# Patient Record
Sex: Female | Born: 1980 | Race: White | Hispanic: No | Marital: Married | Smoking: Never smoker
Health system: Southern US, Community
[De-identification: ages and names within clinical notes are randomized; demographics above are authoritative.]

## PROBLEM LIST (undated history)

## (undated) DIAGNOSIS — Z302 Encounter for sterilization: Secondary | ICD-10-CM

## (undated) DIAGNOSIS — O1490 Unspecified pre-eclampsia, unspecified trimester: Secondary | ICD-10-CM

## (undated) DIAGNOSIS — E669 Obesity, unspecified: Secondary | ICD-10-CM

## (undated) DIAGNOSIS — E282 Polycystic ovarian syndrome: Secondary | ICD-10-CM

## (undated) DIAGNOSIS — R87810 Cervical high risk human papillomavirus (HPV) DNA test positive: Secondary | ICD-10-CM

## (undated) DIAGNOSIS — R87610 Atypical squamous cells of undetermined significance on cytologic smear of cervix (ASC-US): Secondary | ICD-10-CM

## (undated) HISTORY — DX: Obesity, unspecified: E66.9

## (undated) HISTORY — DX: Atypical squamous cells of undetermined significance on cytologic smear of cervix (ASC-US): R87.610

## (undated) HISTORY — DX: Encounter for sterilization: Z30.2

## (undated) HISTORY — DX: Polycystic ovarian syndrome: E28.2

## (undated) HISTORY — DX: Cervical high risk human papillomavirus (HPV) DNA test positive: R87.810

## (undated) HISTORY — DX: Unspecified pre-eclampsia, unspecified trimester: O14.90

---
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
## 2004-08-05 ENCOUNTER — Emergency Department: Payer: Self-pay | Admitting: Emergency Medicine

## 2006-01-09 ENCOUNTER — Observation Stay: Payer: Self-pay

## 2006-01-16 ENCOUNTER — Observation Stay: Payer: Self-pay | Admitting: Obstetrics & Gynecology

## 2006-01-25 ENCOUNTER — Observation Stay: Payer: Self-pay | Admitting: Obstetrics & Gynecology

## 2006-01-26 ENCOUNTER — Inpatient Hospital Stay: Payer: Self-pay | Admitting: Unknown Physician Specialty

## 2006-01-26 DIAGNOSIS — O139 Gestational [pregnancy-induced] hypertension without significant proteinuria, unspecified trimester: Secondary | ICD-10-CM

## 2008-09-29 ENCOUNTER — Ambulatory Visit: Payer: Self-pay | Admitting: Unknown Physician Specialty

## 2011-08-10 ENCOUNTER — Inpatient Hospital Stay: Payer: Self-pay | Admitting: Obstetrics and Gynecology

## 2011-08-10 LAB — PIH PROFILE
Anion Gap: 11 (ref 7–16)
BUN: 8 mg/dL (ref 7–18)
Calcium, Total: 8.2 mg/dL — ABNORMAL LOW (ref 8.5–10.1)
EGFR (Non-African Amer.): >60
Glucose: 66 mg/dL (ref 65–99)
HCT: 33.5 % — ABNORMAL LOW (ref 35.0–47.0)
MCH: 26.9 pg (ref 26.0–34.0)
MCHC: 33.4 g/dL (ref 32.0–36.0)
MCV: 80 fL (ref 80–100)
Osmolality: 278 (ref 275–301)
Platelet: 171 10*3/uL (ref 150–440)
Potassium: 3.8 mmol/L (ref 3.5–5.1)
RBC: 4.17 10*6/uL (ref 3.80–5.20)
RDW: 14.9 % — ABNORMAL HIGH (ref 11.5–14.5)
Sodium: 141 mmol/L (ref 136–145)
WBC: 8 10*3/uL (ref 3.6–11.0)

## 2011-08-10 LAB — PROTEIN / CREATININE RATIO, URINE
Protein, Random Urine: 72 mg/dL — ABNORMAL HIGH (ref 0–12)
Protein/Creat. Ratio: 561 mg/gCREAT — ABNORMAL HIGH (ref 0–200)

## 2011-08-11 LAB — SGOT (AST)(ARMC): SGOT(AST): 19 U/L (ref 15–37)

## 2011-08-11 LAB — BASIC METABOLIC PANEL
Anion Gap: 12 (ref 7–16)
Calcium, Total: 7.9 mg/dL — ABNORMAL LOW (ref 8.5–10.1)
Chloride: 109 mmol/L — ABNORMAL HIGH (ref 98–107)
EGFR (Non-African Amer.): >60
Glucose: 61 mg/dL — ABNORMAL LOW (ref 65–99)
Osmolality: 278 (ref 275–301)
Potassium: 4.1 mmol/L (ref 3.5–5.1)
Sodium: 141 mmol/L (ref 136–145)

## 2011-08-11 LAB — RBC: RBC: 3.48 10*6/uL — ABNORMAL LOW (ref 3.80–5.20)

## 2011-08-11 LAB — HEMATOCRIT: HCT: 28 % — ABNORMAL LOW (ref 35.0–47.0)

## 2011-08-11 LAB — HEMOGLOBIN: HGB: 9.3 g/dL — ABNORMAL LOW (ref 12.0–16.0)

## 2011-08-11 LAB — PLATELET COUNT: Platelet: 142 10*3/uL — ABNORMAL LOW (ref 150–440)

## 2011-08-12 LAB — HEMATOCRIT: HCT: 28.6 % — ABNORMAL LOW (ref 35.0–47.0)

## 2011-08-17 LAB — PATHOLOGY REPORT

## 2012-07-04 DIAGNOSIS — R87810 Cervical high risk human papillomavirus (HPV) DNA test positive: Secondary | ICD-10-CM

## 2012-07-04 HISTORY — DX: Cervical high risk human papillomavirus (HPV) DNA test positive: R87.810

## 2014-08-01 HISTORY — PX: COLPOSCOPY W/ BIOPSY / CURETTAGE: SUR283

## 2014-10-26 NOTE — Op Note (Signed)
PATIENT NAME:  Kimberly Holder, Kimberly D MR#:  [id number] DATE OF BIRTH:  1981-01-31  DATE OF PROCEDURE:  08/10/2011  PREOPERATIVE DIAGNOSIS: Nonreassuring tracing remote from delivery.   POSTOPERATIVE DIAGNOSES:  1. Nonreassuring tracing remote from delivery. 2. Abnormal-appearing placenta.   PROCEDURE: Repeat cesarean section.   SURGEON: Elliot Gurneyarrie C. Tayte Mcwherter, M.D.   ASSISTANT: Shella Maximarcia  Putnam, CNM  ESTIMATED BLOOD LOSS: 1000 mL.   ANESTHESIA: Spinal and local.   FINDINGS: Term 4738 week liveborn female infant weighing 7 pounds, 10 ounces and Apgars 9 and 9, delivered by repeat cesarean.   DESCRIPTION OF PROCEDURE: The patient was taken to the operative room and placed in supine position, after adequate spinal anesthesia was instilled. Time out was performed and the patient was prepped and draped in the usual sterile fashion, after an incision was drawn on her abdomen to mark the old incision. A Pfannenstiel skin incision was made approximately three fingerbreadths above the pubic symphysis and carried sharply down to the fascia. The fascia was nicked in the midline and the incision was extended in a superolateral manner. There was quite a bit of scarring seen at this point and suture scissors and curved Mayo's were used to cut through the thickened scarring to get to the fascia to extend the incision with good visualization. The muscle bellies were sharply and bluntly dissected off the rectus fascia. The muscle belly midline was identified and separated. The peritoneum was grasped, cut and extended with the curved Mayo scissors. At this time, there was noted to be a large amount of scarring of the peritoneum to the lower uterine segment. These were clipped and a bladder flap was created, bladder blade was replaced, an uterine incision was made, and  the infant's head was delivered. With vacuum assist and large fundal pressure the infant was delivered. Cord was clamped and cut, infant was bulb suctioned, infant  was handed to awaiting neonatology professional, cord blood was obtained, Pitocin was started, and the placenta was delivered. At this time, some unusual dark, black pointed individual masses were identified in the placenta. At points they looked like worms or pincer graspers of bugs. One of these was pressed on and opened and a large blood clot came out of the corresponding blood vessel. The placenta was sent to pathology. The uterus was wrapped in a moist laparotomy sponge and the anterior uterus was curetted. The lower uterine segment was closed in a running locked chromic suture with an imbricating chromic suture. Two figure-of-eights were placed in the left lower segment and one in the right lower aspect of the incision. The bladder flap was tacked back up to the incision. The belly was cleared of clots and irrigated. The uterus was placed back into the abdomen. The peritoneum was grasped with [facility] and the gutters were then cleaned with more fluid and moist laparotomy sponges.  Suture of Vicryl was then used to approximate the muscle bellies. The muscle bellies were irrigated after Interceed was placed along the uterine incision. The On-Q pain pump was then inserted into the anterior aspect of the rectus muscles. The catheters were threaded and wrapped around on top of the rectus muscles and placed underneath the fascia. The fascia was closed with a running Vicryl suture. The subcutaneous fat was irrigated and cauterized in areas that were bleeding. A plain gut suture was then used to approximate Camper's and Scarpa's fascia, skin clips were placed on the skin, and 2 x 2's were placed around the catheters at the entrance  into the skin after Dermabond had been placed. Two Tegaderms were placed over the catheters and the 4 x 4's and anchored with long Steri-Strips. The catheters were primed with 5 mL each of Marcaine, 0.5%, and then a bandage was placed, first Telfa and then 4 x 4's over the incision. The  uterus was compressed to remove all clots. Clear urine was noted in the Foley bag. The drapes were taken down. The patient was taken to recovery after having tolerated the procedure well.  ____________________________ Elliot Gurney, MD cck:slb D: 08/12/2011 00:21:05 ET T: 08/12/2011 09:33:01 ET JOB#: [id number]  cc: Elliot Gurney, MD, <Dictator> Elliot Gurney MD ELECTRONICALLY SIGNED 09/04/2011 1:21

## 2014-11-11 NOTE — H&P (Signed)
L&D Evaluation:  History:   HPI 34 year old G4P1021 at 38 weeks 3 days sent from office earlier today for PIH eval. BP in office 140/90, 1+ protein dip, swelling of hands, legs, face noted. No c/o headaches or visual disturbances.  EDD 08/21/11, has hx of previous c/s, desires repeat and was [facility] notable for early entry to care and 66lb weight gain.  Labs: A Pos, RI, VI, Hep B and HIV Negataive, glucola 133 GBS Negative Flu vaccine and Tdap both given December 2012    Presents with other, PIH eval    Patient's Medical History No Chronic Illness    Patient's Surgical History Previous C-Section    Medications Pre Natal Vitamins    Allergies NKDA    Social History none    Family History Non-Contributory   ROS:   ROS All systems were reviewed.  HEENT, CNS, GI, GU, Respiratory, CV, Renal and Musculoskeletal systems were found to be normal.   Exam:   Vital Signs BP >140/90  some WNL BP's    Urine Protein 1+, P/C ratio 561    General no apparent distress    Mental Status clear    Chest clear    Abdomen gravid, non-tender    Estimated Fetal Weight Average for gestational age    Back no CVAT    Edema 3+  Pitting    Reflexes 2+    Clonus negative    Pelvic no external lesions    Mebranes Intact    FHT other, some variable decels noted    Ucx regular, absent    Skin dry   Impression:   Impression evaluation for PIH, IUP 38 3/7, elevated BP   Plan:   Plan EFM/NST, PIH panel, Plan for repeat c/s    Comments Labs WNL except P/C ratio 561 After consulting with Dr Janene HarveyKlett and considering pt's FHR tracing, edema and BP's will do repeat c/s today.   Electronic Signatures: Shella Maximutnam, Audrinna Sherman (CNM)  (Signed 06-Feb-13 21:56)  Authored: L&D Evaluation   Last Updated: 06-Feb-13 21:56 by Shella MaximPutnam, Alexy Bringle (CNM)

## 2015-07-03 ENCOUNTER — Telehealth: Payer: Self-pay | Admitting: *Deleted

## 2015-07-03 NOTE — Telephone Encounter (Signed)
Pt states it feels like she has glass in her foot, and she was wondering if Dr. Al CorpusHyatt was going to take it out that day should she make her appt for later in the day as to not to miss work.  I told her she could and transferred her to schedulers.

## 2015-07-08 ENCOUNTER — Ambulatory Visit: Payer: Self-pay | Admitting: Podiatry

## 2015-07-27 ENCOUNTER — Ambulatory Visit (INDEPENDENT_AMBULATORY_CARE_PROVIDER_SITE_OTHER): Payer: 59 | Admitting: Podiatry

## 2015-07-27 ENCOUNTER — Encounter: Payer: Self-pay | Admitting: Podiatry

## 2015-07-27 ENCOUNTER — Ambulatory Visit (INDEPENDENT_AMBULATORY_CARE_PROVIDER_SITE_OTHER): Payer: 59

## 2015-07-27 VITALS — BP 105/64 | HR 76 | Resp 12

## 2015-07-27 DIAGNOSIS — M795 Residual foreign body in soft tissue: Secondary | ICD-10-CM | POA: Diagnosis not present

## 2015-07-27 NOTE — Progress Notes (Signed)
   Subjective:    Patient ID: Kimberly Holder, female    DOB: 1980/12/11, 35 y.o.   MRN: [id number]  HPI: She presents today concerned that she may have a foreign body to the plantar lateral aspect of her left heel. She states that back in November there was small piece of glass that was broken in her house and she feels that she stepped on it. She states that she has tried removing some of the glass or self and has been successful to some degree however there is small piece that she feels is remaining and is painful with attempted debridement and removal.    Review of Systems  All other systems reviewed and are negative.      Objective:   Physical Exam: 35 year old white female vital signs stable alert and oriented 3 in no apparent distress. Pulses are strongly palpable neurologic sensorium is intact per Semmes-Weinstein monofilament deep tendon reflexes are intact bilateral and equal. Muscle strength +5/5 dorsiflexion plantar flexors and inverters everters all intrinsic musculature is intact. Orthopedic evaluation of strength all joints distal to the ankle for range of motion but crepitation. Cutaneous evaluation of Mr. supple well-hydrated cutis small area to the plantar lateral aspect of the left heel doesn't straighten area of reactive hyperkeratosis and when debrided we were able to remove a small piece of glass measuring less than 2 mm in diameter and length. There is no purulence. No signs of infection.        Assessment & Plan:  Foreign body small piece of glass left.  Plan: Debrided the area today removing a small piece of glass without local anesthetic and without incision.

## 2015-12-31 ENCOUNTER — Ambulatory Visit (INDEPENDENT_AMBULATORY_CARE_PROVIDER_SITE_OTHER): Payer: 59 | Admitting: Primary Care

## 2015-12-31 ENCOUNTER — Encounter: Payer: Self-pay | Admitting: Primary Care

## 2015-12-31 VITALS — BP 112/70 | HR 58 | Temp 97.8°F | Ht 61.5 in | Wt 152.8 lb

## 2015-12-31 DIAGNOSIS — Z0000 Encounter for general adult medical examination without abnormal findings: Secondary | ICD-10-CM

## 2015-12-31 DIAGNOSIS — Z23 Encounter for immunization: Secondary | ICD-10-CM | POA: Diagnosis not present

## 2015-12-31 HISTORY — DX: Encounter for general adult medical examination without abnormal findings: Z00.00

## 2015-12-31 LAB — LIPID PANEL
CHOLESTEROL: 173 mg/dL (ref 0–200)
HDL: 56.7 mg/dL (ref >39.00)
LDL Cholesterol: 105 mg/dL — ABNORMAL HIGH (ref 0–99)
NonHDL: 115.92
Total CHOL/HDL Ratio: 3
Triglycerides: 53 mg/dL (ref 0.0–149.0)
VLDL: 10.6 mg/dL (ref 0.0–40.0)

## 2015-12-31 LAB — COMPREHENSIVE METABOLIC PANEL
ALT: 13 U/L (ref 0–35)
AST: 13 U/L (ref 0–37)
Albumin: 4.4 g/dL (ref 3.5–5.2)
Alkaline Phosphatase: 70 U/L (ref 39–117)
BILIRUBIN TOTAL: 0.5 mg/dL (ref 0.2–1.2)
BUN: 13 mg/dL (ref 6–23)
CALCIUM: 9.3 mg/dL (ref 8.4–10.5)
CHLORIDE: 104 meq/L (ref 96–112)
CO2: 31 mEq/L (ref 19–32)
Creatinine, Ser: 0.77 mg/dL (ref 0.40–1.20)
GFR: 90.36 mL/min (ref >60.00)
Glucose, Bld: 85 mg/dL (ref 70–99)
Potassium: 4.1 mEq/L (ref 3.5–5.1)
Sodium: 138 mEq/L (ref 135–145)
Total Protein: 7.7 g/dL (ref 6.0–8.3)

## 2015-12-31 LAB — HEMOGLOBIN A1C: HEMOGLOBIN A1C: 5 % (ref 4.6–6.5)

## 2015-12-31 NOTE — Assessment & Plan Note (Signed)
Td due, completed today. Pap UTD. Leads a healthy lifestyle and has lost 30 pounds over time. Discussed to start exercising. Exam unremarkable. Labs pending. Follow up in 1 year for repeat physical.

## 2015-12-31 NOTE — Addendum Note (Signed)
Addended by: Tawnya CrookSAMBATH, Muhamed Luecke on: 12/31/2015 09:31 AM   Modules accepted: Orders, SmartSet

## 2015-12-31 NOTE — Progress Notes (Signed)
Pre visit review using our clinic review tool, if applicable. No additional management support is needed unless otherwise documented below in the visit note. 

## 2015-12-31 NOTE — Patient Instructions (Signed)
Complete lab work prior to leaving today. I will notify you of your results once received.   You were provided with a tetanus vaccination today which will cover you for 10 years.   I recommend an annual eye exam.  Continue your efforts towards a healthy lifestyle.  I will complete and fax your form once I receive your labs.   Follow up in 1 year for repeat physical or sooner if needed.  It was a pleasure to meet you today! Please don't hesitate to call me with any questions. Welcome to [facility]!

## 2015-12-31 NOTE — Progress Notes (Signed)
Subjective:    Patient ID: Kimberly Holder, female    DOB: Jul 20, 1980, 35 y.o.   Kimberly Holder: [id number]  HPI  Kimberly Holder is a 35 year old female who presents today to establish care and for complete physical. She has a form that needs to be completed for work.  Immunizations: -Tetanus: Completed 08/07/2003. Due today. -Influenza: Did not complete last season.  Diet: She endorses a healthy diet. Breakfast: Herbal life shake Lunch: Left overs, herbal life shakes, salads with protein Dinner: Pasta, chicken, hamburgers, hot dogs, vegetables, potatoes Snacks: Lean meat, yogurt Desserts: Occasionally  Beverages: Water, flavored water  Exercise: She does not currently exercise Eye exam: Completed several years ago. Dental exam: Completes semi-annually. Pap Smear: Completed in December 2016, normal. History of abnormal in 2015.    Review of Systems  Constitutional: Negative for unexpected weight change.  HENT: Negative for rhinorrhea.   Respiratory: Negative for cough and shortness of breath.   Cardiovascular: Negative for chest pain.  Gastrointestinal: Negative for diarrhea and constipation.  Genitourinary: Negative for difficulty urinating and menstrual problem.  Musculoskeletal: Negative for myalgias and arthralgias.  Skin: Negative for rash.  Allergic/Immunologic: Negative for environmental allergies.  Neurological: Negative for dizziness, numbness and headaches.  Psychiatric/Behavioral:       Denies concerns for anxiety and depression       No past medical history on file.   Social History   Social History  . Marital Status: Married    Spouse Name: N/A  . Number of Children: N/A  . Years of Education: N/A   Occupational History  . Not on file.   Social History Main Topics  . Smoking status: Never Smoker   . Smokeless tobacco: Not on file  . Alcohol Use: No  . Drug Use: No  . Sexual Activity: Not on file   Other Topics Concern  . Not on file   Social History  Narrative   Married.   2 children.    Works for [employer], [employer], [employer].   Enjoys spending time with family.    Past Surgical History  Procedure Laterality Date  . Tonsillectomy and adenoidectomy  1996  . Cesarean section      Family History  Problem Relation Age of Onset  . Arthritis Mother   . Hypertension Father   . Arthritis Brother   . Diabetes Mother     No Known Allergies  No current outpatient prescriptions on file prior to visit.   No current facility-administered medications on file prior to visit.    BP 112/70 mmHg  Pulse 58  Temp(Src) 97.8 F (36.6 C) (Oral)  Ht 5' 1.5" (1.562 m)  Wt 152 lb 12.8 oz (69.31 kg)  BMI 28.41 kg/m2  SpO2 98%  LMP 12/24/2015    Objective:   Physical Exam  Constitutional: She is oriented to person, place, and time. She appears well-nourished.  HENT:  Right Ear: Tympanic membrane and ear canal normal.  Left Ear: Tympanic membrane and ear canal normal.  Nose: Nose normal.  Mouth/Throat: Oropharynx is clear and moist.  Eyes: Conjunctivae and EOM are normal. Pupils are equal, round, and reactive to light.  Neck: Neck supple. No thyromegaly present.  Cardiovascular: Normal rate and regular rhythm.   No murmur heard. Pulmonary/Chest: Effort normal and breath sounds normal. She has no rales.  Abdominal: Soft. Bowel sounds are normal. There is no tenderness.  Musculoskeletal: Normal range of motion.  Lymphadenopathy:    She has no cervical adenopathy.  Neurological: She is alert and oriented to person, place, and time. She has normal reflexes. No cranial nerve deficit.  Skin: Skin is warm and dry. No rash noted.  Psychiatric: She has a normal mood and affect.          Assessment & Plan:

## 2016-01-01 ENCOUNTER — Encounter: Payer: Self-pay | Admitting: *Deleted

## 2017-01-13 ENCOUNTER — Encounter: Payer: 59 | Admitting: Primary Care

## 2017-02-09 ENCOUNTER — Encounter: Payer: Self-pay | Admitting: Primary Care

## 2017-02-09 ENCOUNTER — Ambulatory Visit (INDEPENDENT_AMBULATORY_CARE_PROVIDER_SITE_OTHER): Payer: 59 | Admitting: Primary Care

## 2017-02-09 VITALS — BP 106/72 | HR 60 | Temp 98.0°F | Ht 61.5 in | Wt 167.0 lb

## 2017-02-09 DIAGNOSIS — T63441A Toxic effect of venom of bees, accidental (unintentional), initial encounter: Secondary | ICD-10-CM | POA: Diagnosis not present

## 2017-02-09 DIAGNOSIS — Z0000 Encounter for general adult medical examination without abnormal findings: Secondary | ICD-10-CM | POA: Diagnosis not present

## 2017-02-09 LAB — LIPID PANEL
CHOLESTEROL: 152 mg/dL (ref 0–200)
HDL: 51.5 mg/dL (ref >39.00)
LDL Cholesterol: 92 mg/dL (ref 0–99)
NonHDL: 100.76
TRIGLYCERIDES: 43 mg/dL (ref 0.0–149.0)
Total CHOL/HDL Ratio: 3
VLDL: 8.6 mg/dL (ref 0.0–40.0)

## 2017-02-09 LAB — COMPREHENSIVE METABOLIC PANEL
ALBUMIN: 4.4 g/dL (ref 3.5–5.2)
ALK PHOS: 68 U/L (ref 39–117)
ALT: 17 U/L (ref 0–35)
AST: 16 U/L (ref 0–37)
BUN: 11 mg/dL (ref 6–23)
CALCIUM: 9 mg/dL (ref 8.4–10.5)
CHLORIDE: 105 meq/L (ref 96–112)
CO2: 30 mEq/L (ref 19–32)
CREATININE: 0.81 mg/dL (ref 0.40–1.20)
GFR: 84.7 mL/min (ref >60.00)
Glucose, Bld: 85 mg/dL (ref 70–99)
Potassium: 4.3 mEq/L (ref 3.5–5.1)
Sodium: 138 mEq/L (ref 135–145)
Total Bilirubin: 0.5 mg/dL (ref 0.2–1.2)
Total Protein: 7.3 g/dL (ref 6.0–8.3)

## 2017-02-09 MED ORDER — EPINEPHRINE 0.3 MG/0.3ML IJ SOAJ: 0.3000 mg | Freq: Once | INTRAMUSCULAR | 0 refills | Status: AC

## 2017-02-09 NOTE — Assessment & Plan Note (Addendum)
Immunizations UTD. Pap UTD. Encouraged her to increase vegetables, fruit, whole grains. Commended her on water intake. Increase exercise. Exam unremarkable. Labs pending. Rx for Epi Pen provided to have on hand for future bee/hornet stings as she works for [employer]. Follow up in 1 year.

## 2017-02-09 NOTE — Progress Notes (Signed)
Subjective:    Patient ID: Kimberly Holder, female    DOB: [birth year], 36 y.o.   MRN: [id number]  HPI  Kimberly Holder is a 36 year old female who presents today for complete physical.  Stung by a [place] hornet at her home one week ago, massive erythema after sting. No wheezing, scratchy throat, shortness of breath. Treated by [employer] through her occupation with antibiotics, they recommended an Epi Pen.  Immunizations: -Tetanus: Completed in 2017 -Influenza: Did not complete last season   Diet: She endorses a healthy diet. She is doing [employer]. Breakfast: Protein shake Lunch: Tuna, protein shake, yogurt, protein bar, fresh fruit Dinner: Chicken, steak, hamburgers, vegetables, sandwiches Snacks: Yogurt, granola bar, beef jerky Desserts: Chocolate, 2-3 days weekly. Beverages: Water, protein shake, Gatorade  Exercise: She does stretching and planks in the morning. Eye exam: Completed 2-3 years ago. Dental exam: Completes annually Pap Smear: Completed in December 2017.   Review of Systems  Constitutional: Negative for unexpected weight change.  HENT: Negative for rhinorrhea.   Respiratory: Negative for cough and shortness of breath.   Cardiovascular: Negative for chest pain.  Gastrointestinal: Negative for constipation and diarrhea.  Genitourinary: Negative for difficulty urinating and menstrual problem.  Musculoskeletal: Negative for arthralgias and myalgias.  Skin: Negative for rash.  Allergic/Immunologic: Negative for environmental allergies.  Neurological: Negative for dizziness, numbness and headaches.  Psychiatric/Behavioral:       Denies concerns for depression and anxiety       No past medical history on file.   Social History   Social History  . Marital status: Married    Spouse name: N/A  . Number of children: N/A  . Years of education: N/A   Occupational History  . Not on file.   Social History Main Topics  . Smoking status: Never Smoker  .  Smokeless tobacco: Never Used  . Alcohol use No  . Drug use: No  . Sexual activity: Not on file   Other Topics Concern  . Not on file   Social History Narrative   Married.   2 children.    Works for [employer], [employer], [employer].   Enjoys spending time with family.    Past Surgical History:  Procedure Laterality Date  . CESAREAN SECTION    . TONSILLECTOMY AND ADENOIDECTOMY  1996    Family History  Problem Relation Age of Onset  . Arthritis Mother   . Hypertension Father   . Arthritis Brother   . Diabetes Mother     No Known Allergies  No current outpatient prescriptions on file prior to visit.   No current facility-administered medications on file prior to visit.     BP 106/72   Pulse 60   Temp 98 F (36.7 C) (Oral)   Ht 5' 1.5" (1.562 m)   Wt 167 lb (75.8 kg)   LMP 01/23/2017   SpO2 98%   BMI 31.04 kg/m    Objective:   Physical Exam  Constitutional: She is oriented to person, place, and time. She appears well-nourished.  HENT:  Right Ear: Tympanic membrane and ear canal normal.  Left Ear: Tympanic membrane and ear canal normal.  Nose: Nose normal.  Mouth/Throat: Oropharynx is clear and moist.  Eyes: Pupils are equal, round, and reactive to light. Conjunctivae and EOM are normal.  Neck: Neck supple. No thyromegaly present.  Cardiovascular: Normal rate and regular rhythm.   No murmur heard. Pulmonary/Chest: Effort normal and breath sounds normal. She has no rales.  Abdominal: Soft. Bowel sounds are normal. There is no tenderness.  Musculoskeletal: Normal range of motion.  Lymphadenopathy:    She has no cervical adenopathy.  Neurological: She is alert and oriented to person, place, and time. She has normal reflexes. No cranial nerve deficit.  Skin: Skin is warm and dry. No rash noted.  Psychiatric: She has a normal mood and affect.          Assessment & Plan:

## 2017-02-09 NOTE — Patient Instructions (Signed)
I sent the Epi Pen to your pharmacy. Hang onto this and use only if needed as discussed.  Complete lab work prior to leaving today. I will notify you of your results once received.   Continue exercising. You should be getting 150 minutes of moderate intensity exercise weekly.  Increase consumption of vegetables, fruit, whole grains.  Ensure you are consuming 64 ounces of water daily.  Follow up in 1 year for your annual exam or sooner if needed.  It was a pleasure to see you today!

## 2017-06-09 ENCOUNTER — Telehealth: Payer: Self-pay | Admitting: *Deleted

## 2017-06-09 NOTE — Telephone Encounter (Signed)
Form has been faxed to [phone]

## 2017-06-09 NOTE — Telephone Encounter (Signed)
Copied from CRM [id number]. Topic: Inquiry >> Jun 09, 2017  7:55 AM Yvonna Alanisobinson, Andra M wrote: Reason for CRM: Patient has faxed a document [employer] form. Patient brought the sheet in during her Physical back in August for completion, but her company states they have never received it. Patient needs the document completed and faxed to her job ASAP. Patient also wants a copy of the AVS and Lab Results from the Physical. Please call patient today at [phone].       Thank You!!!

## 2017-06-09 NOTE — Telephone Encounter (Signed)
I have called patient and notified her that I do not see the form. I told her I usually make a copy of the form if I need to fax the form for patient. Patient went ahead and re-sent the fax today.  Place the form in Kimberly Holder's in box to review and complete.  Place copy of AVS and labs in the front office for patient

## 2017-06-09 NOTE — Telephone Encounter (Signed)
Form completed and placed in Chan's in box for faxing. 

## 2017-06-21 ENCOUNTER — Encounter: Payer: Self-pay | Admitting: Certified Nurse Midwife

## 2017-06-21 ENCOUNTER — Ambulatory Visit (INDEPENDENT_AMBULATORY_CARE_PROVIDER_SITE_OTHER): Payer: 59 | Admitting: Certified Nurse Midwife

## 2017-06-21 ENCOUNTER — Other Ambulatory Visit: Payer: Self-pay

## 2017-06-21 VITALS — BP 108/64 | HR 97 | Ht 61.0 in | Wt 167.0 lb

## 2017-06-21 DIAGNOSIS — Z01419 Encounter for gynecological examination (general) (routine) without abnormal findings: Secondary | ICD-10-CM | POA: Diagnosis not present

## 2017-06-21 DIAGNOSIS — Z1329 Encounter for screening for other suspected endocrine disorder: Secondary | ICD-10-CM | POA: Diagnosis not present

## 2017-06-21 DIAGNOSIS — Z13220 Encounter for screening for lipoid disorders: Secondary | ICD-10-CM

## 2017-06-21 DIAGNOSIS — L680 Hirsutism: Secondary | ICD-10-CM

## 2017-06-21 DIAGNOSIS — Z124 Encounter for screening for malignant neoplasm of cervix: Secondary | ICD-10-CM

## 2017-06-21 DIAGNOSIS — Z131 Encounter for screening for diabetes mellitus: Secondary | ICD-10-CM

## 2017-06-21 NOTE — Progress Notes (Signed)
Gynecology Annual Exam  PCP: Doreene Nestlark, Katherine K, NP  Chief Complaint:  Chief Complaint  Patient presents with  . Gynecologic Exam    No problems.     History of Present Illness:Kimberly Holder is a 36 year old Caucasian/White female, G4 P2022, who presents for her annual exam. She is having no significant GYN problems. Has noticed more hair growth on her chin this past year.  Her menses are regular and her LMP was 05/24/2017. They occur every 1 month, they last 5 days, are medium flow with one heavier day requiring pad change every 3-4 hours, and are with clots.  She has had no spotting.  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 Had a colposcopy done in 2016 and had negative biopsies.  Since her last annual GYN exam dated 06/16/2016, she has gained weight (currently 13# heavier since her last annual), but has started losing weight by doing Herbal Life shakes and exercising.  She is sexually active. She is currently using condoms for contraception.  Her most recent pap smear was obtained 06/16/2016 and was NIL/negative Her most recent mammogram obtained on 06/16/2015 was normal.  There is no family history of breast cancer.  There is no family history of ovarian cancer.  The patient does do self breast exams every month.  The patient does not smoke.  The patient does not drink alcohol.  The patient does not use illegal drugs.  The patient is exercising daily at home. Current BMI is 31.55kg/m2. The patient does get adequate calcium in her diet.  She has had a recent cholesterol screen in 2017and is interested in repeating labs and screening for diabetes.   Review of Systems: Review of Systems  Constitutional: Negative for chills, fever and weight loss.  HENT: Negative for congestion, sinus pain and sore throat.   Eyes: Negative for blurred vision  and pain.  Respiratory: Negative for hemoptysis, shortness of breath and wheezing.   Cardiovascular: Negative for chest pain, palpitations and leg swelling.  Gastrointestinal: Negative for abdominal pain, blood in stool, diarrhea, heartburn, nausea and vomiting.  Genitourinary: Negative for dysuria, frequency, hematuria and urgency.  Musculoskeletal: Negative for back pain, joint pain and myalgias.  Skin: Negative for itching and rash.  Neurological: Negative for dizziness, tingling and headaches.  Endo/Heme/Allergies: Negative for environmental allergies and polydipsia. Does not bruise/bleed easily.       Positive for hirsutism   Psychiatric/Behavioral: Negative for depression. The patient is not nervous/anxious and does not have insomnia.     Past Medical History:  Past Medical History:  Diagnosis Date  . Cervical high risk HPV (human papillomavirus) test positive 2014    Past Surgical History:  Past Surgical History:  Procedure Laterality Date  . CESAREAN SECTION    . COLPOSCOPY W/ BIOPSY / CURETTAGE  08/01/2014   BX Neg  . TONSILLECTOMY AND ADENOIDECTOMY  1996    Family History:  Family History  Problem Relation Age of Onset  . Arthritis Mother   . Diabetes Mother        also had a pituitary adenoma  . Hypertension Father   . Arthritis Brother   . Diabetes Maternal Grandmother   . Heart disease Maternal Grandmother    OB History  Gravida Para Term Preterm AB Living  4 2 2   2 2   SAB TAB Ectopic  Multiple Live Births  1 1     2     # Outcome Date GA Lbr Len/2nd Weight Sex Delivery Anes PTL Lv  4 Term 08/10/11 2959w0d  7 lb 10 oz (3.459 kg) M CS-LTranv   LIV  3 Term 01/26/06 3628w0d  7 lb 4 oz (3.289 kg) F CS-LTranv   LIV     Complications: Fetal Intolerance,Failure to Progress in First Stage,Pregnancy induced hypertension  2 TAB      TAB     1 SAB      SAB        Social History:  Social History   Socioeconomic History  . Marital status: Married    Spouse name:  Nestor RampDavid Christian "Thayer OhmChris"  . Number of children: 2  . Years of education: Not on file  . Highest education level: Not on file  Social Needs  . Financial resource strain: Not on file  . Food insecurity - worry: Not on file  . Food insecurity - inability: Not on file  . Transportation needs - medical: Not on file  . Transportation needs - non-medical: Not on file  Occupational History  . 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 Smoking status: Never Smoker  . Smokeless tobacco: Never Used  Substance and Sexual Activity  . Alcohol use: No    Alcohol/week: 0.0 oz  . Drug use: No  . Sexual activity: Yes  Other Topics Concern  . Not on file  Social History Narrative   Married.   2 children.    Works for [employer], [employer], [employer].   Enjoys spending time with family.    Allergies:  No Known Allergies  Medications: Herbalife vitamins Physical Exam Vitals: BP 108/64   Pulse 97   Ht 5\' 1"  (1.549 m)   Wt 167 lb (75.8 kg)   LMP 05/24/2017   BMI 31.55 kg/m   General: WF in NAD HEENT: normocephalic, anicteric; scattered hair growth on chin Neck: no thyroid enlargement, no palpable nodules, no cervical lymphadenopathy  Pulmonary: No increased work of breathing, CTAB Cardiovascular: RRR, without murmur  Breast: Breast symmetrical, no tenderness, no palpable nodules or masses, no skin or nipple retraction present, no nipple discharge.  No axillary, infraclavicular or supraclavicular lymphadenopathy. Abdomen: Soft, non-tender, non-distended.  Umbilicus without lesions.  No hepatomegaly or masses palpable. No evidence of hernia. Dark hair growth midline abdomen to umbilicus Genitourinary:  External: Normal external female genitalia.  Normal urethral meatus, normal Bartholin's and Skene's glands.    Vagina: Normal vaginal mucosa, no evidence of prolapse.    Cervix: Grossly normal in appearance, no bleeding, non-tender, several tiny Nabotholin cysts at 6 PM.  Uterus: Anteverted,  normal size, shape, and consistency, mobile, and non-tender  Adnexa: No adnexal masses, non-tender  Rectal: deferred  Lymphatic: no evidence of inguinal lymphadenopathy Extremities: no edema, erythema, or tenderness Neurologic: Grossly intact Psychiatric: mood appropriate, affect full     Assessment: 36 y.o. annual gyn exam History of abnormal Pap smears/ positive HRHPV with first normal last year since 2014 Hirsutism (has a past history of oligomenorrhea-?PCOS)  Plan:  1) Breast cancer screening - recommend monthly self breast exam. Start annual mammograms at age 36.  2) Cervical cancer screening - Pap was done.   3) Contraception - wishes to continue condom use. Taking Herbalife vitamins  4) Routine healthcare maintenance including cholesterol and diabetes screening ordered today. Will add TSH and testosterone levels.  5) RTO 1 year and prn. Will notify of lab results  Dalia Heading, CNM

## 2017-06-22 LAB — LIPID PANEL WITH LDL/HDL RATIO
CHOLESTEROL TOTAL: 172 mg/dL (ref 100–199)
HDL: 57 mg/dL (ref >39)
LDL Calculated: 106 mg/dL — ABNORMAL HIGH (ref 0–99)
LDL/HDL RATIO: 1.9 ratio (ref 0.0–3.2)
TRIGLYCERIDES: 45 mg/dL (ref 0–149)
VLDL Cholesterol Cal: 9 mg/dL (ref 5–40)

## 2017-06-22 LAB — TSH: TSH: 0.997 u[IU]/mL (ref 0.450–4.500)

## 2017-06-22 LAB — TESTOSTERONE,FREE AND TOTAL
TESTOSTERONE FREE: 12.4 pg/mL — AB (ref 0.0–4.2)
Testosterone: 58 ng/dL — ABNORMAL HIGH (ref 8–48)

## 2017-06-22 LAB — HGB A1C W/O EAG: HEMOGLOBIN A1C: 5.1 % (ref 4.8–5.6)

## 2017-06-23 LAB — IGP, APTIMA HPV
HPV APTIMA: NEGATIVE
PAP Smear Comment: 0

## 2017-07-05 ENCOUNTER — Telehealth: Payer: Self-pay

## 2017-07-05 DIAGNOSIS — R7989 Other specified abnormal findings of blood chemistry: Secondary | ICD-10-CM

## 2017-07-05 DIAGNOSIS — L680 Hirsutism: Secondary | ICD-10-CM

## 2017-07-05 NOTE — Telephone Encounter (Signed)
Patient called with lab results and advised that further testing is needed due to testosterone levels being elevated. Patient to schedule an appointment for labs. Will call with results.

## 2017-07-05 NOTE — Telephone Encounter (Signed)
Pt has a question regarding lab results, specifically elevated testosterone levels and high LDL. Cb# [phone] thank you.

## 2017-07-06 ENCOUNTER — Other Ambulatory Visit: Payer: 59

## 2017-07-06 DIAGNOSIS — R7989 Other specified abnormal findings of blood chemistry: Secondary | ICD-10-CM

## 2017-07-06 DIAGNOSIS — L680 Hirsutism: Secondary | ICD-10-CM

## 2017-07-09 LAB — COMPREHENSIVE METABOLIC PANEL
ALBUMIN: 4.5 g/dL (ref 3.5–5.5)
ALK PHOS: 86 IU/L (ref 39–117)
ALT: 28 IU/L (ref 0–32)
AST: 24 IU/L (ref 0–40)
Albumin/Globulin Ratio: 1.5 (ref 1.2–2.2)
BILIRUBIN TOTAL: 0.5 mg/dL (ref 0.0–1.2)
BUN / CREAT RATIO: 16 (ref 9–23)
BUN: 13 mg/dL (ref 6–20)
CHLORIDE: 103 mmol/L (ref 96–106)
CO2: 22 mmol/L (ref 20–29)
CREATININE: 0.8 mg/dL (ref 0.57–1.00)
Calcium: 9.1 mg/dL (ref 8.7–10.2)
GFR calc non Af Amer: 94 mL/min/{1.73_m2} (ref >59)
GFR, EST AFRICAN AMERICAN: 109 mL/min/{1.73_m2} (ref >59)
GLOBULIN, TOTAL: 3 g/dL (ref 1.5–4.5)
Glucose: 72 mg/dL (ref 65–99)
Potassium: 4.6 mmol/L (ref 3.5–5.2)
SODIUM: 138 mmol/L (ref 134–144)
TOTAL PROTEIN: 7.5 g/dL (ref 6.0–8.5)

## 2017-07-09 LAB — 17-HYDROXYPROGESTERONE: 17-Hydroxyprogesterone: 50 ng/dL

## 2017-07-09 LAB — DHEA-SULFATE: DHEA SO4: 474.4 ug/dL — AB (ref 57.3–279.2)

## 2017-07-09 LAB — ANDROSTENEDIONE: Androstenedione: 85 ng/dL (ref 41–262)

## 2017-07-10 ENCOUNTER — Telehealth: Payer: Self-pay | Admitting: Certified Nurse Midwife

## 2017-07-10 DIAGNOSIS — L680 Hirsutism: Secondary | ICD-10-CM | POA: Insufficient documentation

## 2017-07-10 MED ORDER — NORGESTIMATE-ETH ESTRADIOL 0.25-35 MG-MCG PO TABS: 1.0000 | ORAL_TABLET | Freq: Every day | ORAL | 11 refills | Status: DC

## 2017-07-10 NOTE — Telephone Encounter (Signed)
Patient called with lab results. DHEAS elevated to 474 .4 c/w PCOS. Androstenedione, CMP and 17OHP were normal. Discussed treatment options for Hirsutism. Recommend ovarian suppression with OCPs/ Nuvaring/Patch +/- Aldactone. DIscussed pros and cons, side effects and risks of these treatments. Explained the need to be on meds to suppress ovaries for 6 months before seeing significant reduction in new hair growth. Recommend waiting to do hair removal until after 6 mos. Desires to start Trisprintec. Reviewed how to take pills, when to use back up birth control, and risks of thromboembolism. FU in 3 months for blood pressure check.

## 2017-07-10 NOTE — Telephone Encounter (Signed)
Pt f/u for results of additonal labs that were drawn. [phone]#[phone]

## 2018-05-15 ENCOUNTER — Other Ambulatory Visit: Payer: Self-pay | Admitting: Primary Care

## 2018-05-15 DIAGNOSIS — Z0000 Encounter for general adult medical examination without abnormal findings: Secondary | ICD-10-CM

## 2018-05-23 ENCOUNTER — Other Ambulatory Visit (INDEPENDENT_AMBULATORY_CARE_PROVIDER_SITE_OTHER): Payer: 59

## 2018-05-23 DIAGNOSIS — Z0000 Encounter for general adult medical examination without abnormal findings: Secondary | ICD-10-CM | POA: Diagnosis not present

## 2018-05-23 LAB — LIPID PANEL
CHOL/HDL RATIO: 3
Cholesterol: 170 mg/dL (ref 0–200)
HDL: 53.7 mg/dL (ref >39.00)
LDL CALC: 102 mg/dL — AB (ref 0–99)
NONHDL: 116.34
TRIGLYCERIDES: 72 mg/dL (ref 0.0–149.0)
VLDL: 14.4 mg/dL (ref 0.0–40.0)

## 2018-05-23 LAB — COMPREHENSIVE METABOLIC PANEL
ALT: 14 U/L (ref 0–35)
AST: 14 U/L (ref 0–37)
Albumin: 4.2 g/dL (ref 3.5–5.2)
Alkaline Phosphatase: 64 U/L (ref 39–117)
BILIRUBIN TOTAL: 0.7 mg/dL (ref 0.2–1.2)
BUN: 12 mg/dL (ref 6–23)
CALCIUM: 9 mg/dL (ref 8.4–10.5)
CO2: 27 mEq/L (ref 19–32)
CREATININE: 0.77 mg/dL (ref 0.40–1.20)
Chloride: 103 mEq/L (ref 96–112)
GFR: 89.17 mL/min (ref >60.00)
GLUCOSE: 87 mg/dL (ref 70–99)
Potassium: 4 mEq/L (ref 3.5–5.1)
SODIUM: 136 meq/L (ref 135–145)
Total Protein: 7.5 g/dL (ref 6.0–8.3)

## 2018-05-30 ENCOUNTER — Encounter: Payer: Self-pay | Admitting: Primary Care

## 2018-05-30 ENCOUNTER — Ambulatory Visit (INDEPENDENT_AMBULATORY_CARE_PROVIDER_SITE_OTHER): Payer: 59 | Admitting: Primary Care

## 2018-05-30 VITALS — BP 114/70 | HR 76 | Temp 98.2°F | Ht 61.0 in | Wt 179.2 lb

## 2018-05-30 DIAGNOSIS — Z0000 Encounter for general adult medical examination without abnormal findings: Secondary | ICD-10-CM | POA: Diagnosis not present

## 2018-05-30 NOTE — Patient Instructions (Signed)
Start exercising. You should be getting 150 minutes of moderate intensity exercise weekly.  It's important to improve your diet by reducing consumption of fast food, fried food, processed snack foods, sugary drinks. Increase consumption of fresh vegetables and fruits, whole grains, water.  Ensure you are drinking 64 ounces of water daily.  We will see you next year or sooner if needed. It was a pleasure to see you today!   Preventive Care 18-39 Years, Female Preventive care refers to lifestyle choices and visits with your health care provider that can promote health and wellness. What does preventive care include?  A yearly physical exam. This is also called an annual well check.  Dental exams once or twice a year.  Routine eye exams. Ask your health care provider how often you should have your eyes checked.  Personal lifestyle choices, including: ? Daily care of your teeth and gums. ? Regular physical activity. ? Eating a healthy diet. ? Avoiding tobacco and drug use. ? Limiting alcohol use. ? Practicing safe sex. ? Taking vitamin and mineral supplements as recommended by your health care provider. What happens during an annual well check? The services and screenings done by your health care provider during your annual well check will depend on your age, overall health, lifestyle risk factors, and family history of disease. Counseling Your health care provider may ask you questions about your:  Alcohol use.  Tobacco use.  Drug use.  Emotional well-being.  Home and relationship well-being.  Sexual activity.  Eating habits.  Work and work Statistician.  Method of birth control.  Menstrual cycle.  Pregnancy history.  Screening You may have the following tests or measurements:  Height, weight, and BMI.  Diabetes screening. This is done by checking your blood sugar (glucose) after you have not eaten for a while (fasting).  Blood pressure.  Lipid and  cholesterol levels. These may be checked every 5 years starting at age 27.  Skin check.  Hepatitis C blood test.  Hepatitis B blood test.  Sexually transmitted disease (STD) testing.  BRCA-related cancer screening. This may be done if you have a family history of breast, ovarian, tubal, or peritoneal cancers.  Pelvic exam and Pap test. This may be done every 3 years starting at age 27. Starting at age 32, this may be done every 5 years if you have a Pap test in combination with an HPV test.  Discuss your test results, treatment options, and if necessary, the need for more tests with your health care provider. Vaccines Your health care provider may recommend certain vaccines, such as:  Influenza vaccine. This is recommended every year.  Tetanus, diphtheria, and acellular pertussis (Tdap, Td) vaccine. You may need a Td booster every 10 years.  Varicella vaccine. You may need this if you have not been vaccinated.  HPV vaccine. If you are 25 or younger, you may need three doses over 6 months.  Measles, mumps, and rubella (MMR) vaccine. You may need at least one dose of MMR. You may also need a second dose.  Pneumococcal 13-valent conjugate (PCV13) vaccine. You may need this if you have certain conditions and were not previously vaccinated.  Pneumococcal polysaccharide (PPSV23) vaccine. You may need one or two doses if you smoke cigarettes or if you have certain conditions.  Meningococcal vaccine. One dose is recommended if you are age 64-21 years and a first-year college student living in a residence hall, or if you have one of several medical conditions. You may also  need additional booster doses.  Hepatitis A vaccine. You may need this if you have certain conditions or if you travel or work in places where you may be exposed to hepatitis A.  Hepatitis B vaccine. You may need this if you have certain conditions or if you travel or work in places where you may be exposed to hepatitis  B.  Haemophilus influenzae type b (Hib) vaccine. You may need this if you have certain risk factors.  Talk to your health care provider about which screenings and vaccines you need and how often you need them. This information is not intended to replace advice given to you by your health care provider. Make sure you discuss any questions you have with your health care provider. Document Released: 08/16/2001 Document Revised: 03/09/2016 Document Reviewed: 04/21/2015 Elsevier Interactive Patient Education  [employer].

## 2018-05-30 NOTE — Assessment & Plan Note (Signed)
Tetanus UTD, declines influenza vaccination. Pap smear UTD, follows with GYN. Recommended to work on regular exercise and a healthy diet.  Exam unremarkable. Labs reviewed. Follow up in 1 year for CPE.

## 2018-05-30 NOTE — Progress Notes (Signed)
Subjective:    Patient ID: Kimberly Holder, female    DOB: [birth year], 37 y.o.   MRN: [id number]  HPI  Kimberly Holder is a 37 year old female who presents today for complete physical.  Immunizations: -Tetanus: Completed in 2017 -Influenza: Declines    Diet: She endorses a healthy diet. Breakfast: Cereal, oatmeal, sausage  Lunch: [employer], [employer] shake, sandwich  Dinner: Meat, vegetable, starch Snacks: Crackers, yogurt, applesauce  Desserts: 3-4 days weekly  Beverages: Water, soda, occasional sweet tea  Exercise: She is not exercising, active at work  Eye exam: Completed 6 years ago Dental exam: Completes semi-annually  Pap Smear: Completed in 2018, follows with GYN.   Review of Systems  Constitutional: Negative for unexpected weight change.  HENT: Negative for rhinorrhea.   Respiratory: Negative for cough and shortness of breath.   Cardiovascular: Negative for chest pain.  Gastrointestinal: Negative for constipation and diarrhea.  Genitourinary: Negative for difficulty urinating and menstrual problem.  Musculoskeletal: Negative for arthralgias and myalgias.  Skin: Negative for rash.  Allergic/Immunologic: Negative for environmental allergies.  Neurological: Negative for dizziness, numbness and headaches.  Psychiatric/Behavioral: The patient is not nervous/anxious.        Past Medical History:  Diagnosis Date  . Cervical high risk HPV (human papillomavirus) test positive 2014  . PCOS (polycystic ovarian syndrome)      Social History   Socioeconomic History  . Marital status: Married    Spouse name: Nestor RampDavid Christian "Thayer OhmChris"  . Number of children: 2  . Years of education: Not on file  . Highest education level: Not on file  Occupational History  . 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 Financial resource strain: Not on file  . Food insecurity:    Worry: Not on file    Inability: Not on file  . Transportation needs:    Medical: Not on file   Non-medical: Not on file  Tobacco Use  . Smoking status: Never Smoker  . Smokeless tobacco: Never Used  Substance and Sexual Activity  . Alcohol use: No    Alcohol/week: 0.0 standard drinks  . Drug use: No  . Sexual activity: Yes  Lifestyle  . Physical activity:    Days per week: 6 days    Minutes per session: 20 min  . Stress: Not at all  Relationships  . Social connections:    Talks on phone: More than three times a week    Gets together: Three times a week    Attends religious service: Never    Active member of club or organization: No    Attends meetings of clubs or organizations: Never    Relationship status: Married  . Intimate partner violence:    Fear of current or ex partner: No    Emotionally abused: No    Physically abused: No    Forced sexual activity: No  Other Topics Concern  . Not on file  Social History Narrative   Married.   2 children.    Works for [employer], [employer], [employer].   Enjoys spending time with family.    Past Surgical History:  Procedure Laterality Date  . CESAREAN SECTION    . COLPOSCOPY W/ BIOPSY / CURETTAGE  08/01/2014   BX Neg  . TONSILLECTOMY AND ADENOIDECTOMY  1996    Family History  Problem Relation Age of Onset  . Arthritis Mother   . Diabetes Mother        also had a pituitary adenoma  .  Hypertension Father   . Arthritis Brother   . Diabetes Maternal Grandmother   . Heart disease Maternal Grandmother     No Known Allergies  Current Outpatient Medications on File Prior to Visit  Medication Sig Dispense Refill  . norgestimate-ethinyl estradiol (ORTHO-CYCLEN,SPRINTEC,PREVIFEM) 0.25-35 MG-MCG tablet Take 1 tablet by mouth daily. 1 Package 11   No current facility-administered medications on file prior to visit.     BP 114/70   Pulse 76   Temp 98.2 F (36.8 C) (Oral)   Ht 5\' 1"  (1.549 m)   Wt 179 lb 4 oz (81.3 kg)   LMP 05/16/2018   SpO2 98%   BMI 33.87 kg/m    Objective:   Physical Exam    Constitutional: She is oriented to person, place, and time. She appears well-nourished.  HENT:  Mouth/Throat: No oropharyngeal exudate.  Eyes: Pupils are equal, round, and reactive to light. EOM are normal.  Neck: Neck supple. No thyromegaly present.  Cardiovascular: Normal rate and regular rhythm.  Respiratory: Effort normal and breath sounds normal.  GI: Soft. Bowel sounds are normal. There is no tenderness.  Musculoskeletal: Normal range of motion.  Neurological: She is alert and oriented to person, place, and time.  Skin: Skin is warm and dry.  Psychiatric: She has a normal mood and affect.           Assessment & Plan:

## 2018-06-15 ENCOUNTER — Telehealth: Payer: Self-pay

## 2018-06-15 NOTE — Telephone Encounter (Signed)
Pt has appt 12/20; she is 37yr old; had mammogram 2-3 yrs ago; does she need to have it done again?  [phone]  Adv yearly mammograms at age 37, baseline between ages 5435-40.

## 2018-06-21 NOTE — Progress Notes (Signed)
Gynecology Annual Exam  PCP: Doreene Nestlark, Katherine K, NP  Chief Complaint:  Chief Complaint  Patient presents with  . Gynecologic Exam  . Weight Gain    History of Present Illness:Kimberly Holder is a 37 year old Caucasian/White female, G4 P2022, who presents for her annual exam. She has a history of PCOS and was noticing more facial hair growth last year. She was restarted on OCPs. Her menses are regular on the pills and her LMP was 06/12/2018. They occur every 1month, they last 5-6 days, are medium flow with one heavier day requiring pad change every 3-4 hours. She denies dysmenorrhea She has had no spotting.  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 Had a colposcopy done in 2016 and had negative biopsies. She has had 2 normal Pap smears in the last 2 years.  Since her last annual GYN exam dated 06/21/17, she has gained 10#, although she has has continued doing Herbal Life shakes and exercising. Her mother had a brain tumor removed 02/2018 She is sexually active. She is currently using Orthocyclen for contraception.  Her most recent pap smear was obtained 06/21/2017 and was NIL/negative Her most recent mammogram obtained on 06/16/2015 was normal.  There is no family history of breast cancer.  There is no family history of ovarian cancer.  The patient does do self breast exams every month.  The patient does not smoke.  The patient does not drink alcohol.  The patient does not use illegal drugs.  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 Current BMI is 33.46 kg/m2. The patient does get adequate calcium in her diet.  She has had a recent cholesterol screen in 2019 that was normal.    Review of Systems: Review of Systems  Constitutional: Negative for chills, fever and weight loss.   HENT: Negative for congestion, sinus pain and sore throat.   Eyes: Negative for blurred vision and pain.  Respiratory: Negative for hemoptysis, shortness of breath and wheezing.   Cardiovascular: Negative for chest pain, palpitations and leg swelling.  Gastrointestinal: Negative for abdominal pain, blood in stool, diarrhea, heartburn, nausea and vomiting.  Genitourinary: Negative for dysuria, frequency, hematuria and urgency.  Musculoskeletal: Negative for back pain, joint pain and myalgias.  Skin: Negative for itching and rash.  Neurological: Negative for dizziness, tingling and headaches.  Endo/Heme/Allergies: Negative for environmental allergies and polydipsia. Does not bruise/bleed easily.       Positive for hirsutism   Psychiatric/Behavioral: Negative for depression. The patient is not nervous/anxious and does not have insomnia.     Past Medical History:  Past Medical History:  Diagnosis Date  . Cervical high risk HPV (human papillomavirus) test positive 2014  . PCOS (polycystic ovarian syndrome)     Past Surgical History:  Past Surgical History:  Procedure Laterality Date  . CESAREAN SECTION    . COLPOSCOPY W/ BIOPSY / CURETTAGE  08/01/2014   BX Neg  . TONSILLECTOMY AND ADENOIDECTOMY  1996    Family History:  Family History  Problem Relation Age of Onset  . Arthritis Mother   . Diabetes Mother        also had a pituitary adenoma  . Hypertension Father   . Arthritis Brother   . Diabetes Maternal Grandmother   . Heart  disease Maternal Grandmother    OB History  Gravida Para Term Preterm AB Living  4 2 2   2 2   SAB TAB Ectopic Multiple Live Births  1 1     2     # Outcome Date GA Lbr Len/2nd Weight Sex Delivery Anes PTL Lv  4 Term 08/10/11 [redacted]w[redacted]d  7 lb 10 oz (3.459 kg) M CS-LTranv   LIV  3 Term 01/26/06 [redacted]w[redacted]d  7 lb 4 oz (3.289 kg) F CS-LTranv   LIV     Complications: Fetal Intolerance, Failure to Progress in First Stage, Pregnancy induced hypertension  2 TAB       TAB     1 SAB      SAB      Social History:  Social History   Socioeconomic History  . Marital status: Married    Spouse name: Nestor Ramp "Thayer Ohm"  . Number of children: 2  . Years of education: Not on file  . Highest education level: Not on file  Occupational History  . 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 Financial resource strain: Not on file  . Food insecurity:    Worry: Not on file    Inability: Not on file  . Transportation needs:    Medical: Not on file    Non-medical: Not on file  Tobacco Use  . Smoking status: Never Smoker  . Smokeless tobacco: Never Used  Substance and Sexual Activity  . Alcohol use: No    Alcohol/week: 0.0 standard drinks  . Drug use: No  . Sexual activity: Yes  Lifestyle  . Physical activity:    Days per week: 6 days    Minutes per session: 20 min  . Stress: Not at all  Relationships  . Social connections:    Talks on phone: More than three times a week    Gets together: Three times a week    Attends religious service: Never    Active member of club or organization: No    Attends meetings of clubs or organizations: Never    Relationship status: Married  . Intimate partner violence:    Fear of current or ex partner: No    Emotionally abused: No    Physically abused: No    Forced sexual activity: No  Other Topics Concern  . Not on file  Social History Narrative   Married.   2 children.    Works for [employer], [employer], [employer].   Enjoys spending time with family.    Allergies:  No Known Allergies  Medications: Orthocyclen generic  Herbalife vitamins Physical Exam Vitals: BP 110/70   Ht 5\' 1"  (1.549 m)   Wt 177 lb (80.3 kg)   LMP 06/12/2018   BMI 33.44 kg/m   General: WF in NAD HEENT: normocephalic, anicteric; scattered hair growth on chin Neck: no thyroid enlargement, no palpable nodules, no cervical lymphadenopathy  Pulmonary: No increased work of breathing, CTAB Cardiovascular: RRR, without murmur   Breast: Breast symmetrical, no tenderness, no palpable nodules or masses, no skin or nipple retraction present, no nipple discharge.  No axillary, infraclavicular or supraclavicular lymphadenopathy. Abdomen: Soft, non-tender, non-distended.  Umbilicus without lesions.  No hepatomegaly or masses palpable. No evidence of hernia. Dark hair growth midline abdomen to umbilicus Genitourinary:  External: Normal external female genitalia.  Normal urethral meatus, normal Bartholin's and Skene's glands.    Vagina: Normal vaginal mucosa, no evidence of prolapse.    Cervix: Grossly normal in appearance, no bleeding, non-tender  Uterus: Anteverted, normal size, shape, and consistency, mobile, and non-tender  Adnexa: No adnexal masses, non-tender  Rectal: deferred  Lymphatic: no evidence of inguinal lymphadenopathy Extremities: no edema, erythema, or tenderness Neurologic: Grossly intact Psychiatric: mood appropriate, affect full     Assessment: 37 y.o. annual gyn exam History of abnormal Pap smears/ positive HRHPV with first normal last year since 2014 Hirsutism (has a past history of oligomenorrhea-?PCOS)  Plan:  1) Breast cancer screening - recommend monthly self breast exam. Start annual mammograms at age 37.  2) Cervical cancer screening - Pap was done.   3) Contraception - refilled Orthocyclen #1/ RF x 11  4) Routine healthcare maintenance including cholesterol and diabetes screening managed by PCP  5) RTO 1 year and prn.    Kimberly Holder, CNM

## 2018-06-22 ENCOUNTER — Other Ambulatory Visit (HOSPITAL_COMMUNITY)
Admission: RE | Admit: 2018-06-22 | Discharge: 2018-06-22 | Disposition: A | Discharge status: Home / Self Care | Payer: 59 | Source: Ambulatory Visit | Attending: Certified Nurse Midwife | Admitting: Certified Nurse Midwife

## 2018-06-22 ENCOUNTER — Encounter: Payer: Self-pay | Admitting: Certified Nurse Midwife

## 2018-06-22 ENCOUNTER — Ambulatory Visit (INDEPENDENT_AMBULATORY_CARE_PROVIDER_SITE_OTHER): Payer: 59 | Admitting: Certified Nurse Midwife

## 2018-06-22 VITALS — BP 110/70 | Ht 61.0 in | Wt 177.0 lb

## 2018-06-22 DIAGNOSIS — Z01419 Encounter for gynecological examination (general) (routine) without abnormal findings: Secondary | ICD-10-CM | POA: Insufficient documentation

## 2018-06-22 DIAGNOSIS — Z124 Encounter for screening for malignant neoplasm of cervix: Secondary | ICD-10-CM

## 2018-06-22 DIAGNOSIS — Z3041 Encounter for surveillance of contraceptive pills: Secondary | ICD-10-CM

## 2018-06-22 MED ORDER — NORGESTIMATE-ETH ESTRADIOL 0.25-35 MG-MCG PO TABS
1.0000 | ORAL_TABLET | Freq: Every day | ORAL | 11 refills | Status: DC
Start: 2018-06-22 — End: 2018-08-22

## 2018-06-26 LAB — CYTOLOGY - PAP
Diagnosis: UNDETERMINED — AB
HPV: NOT DETECTED

## 2018-07-10 ENCOUNTER — Telehealth: Payer: Self-pay

## 2018-07-10 ENCOUNTER — Other Ambulatory Visit: Payer: Self-pay | Admitting: Certified Nurse Midwife

## 2018-07-10 NOTE — Telephone Encounter (Signed)
Pt states [facility] doesn't have refills for her bc.  [phone]  Left detailed msg rx was sent to [facility] in [place].  [facility] on [address] [facility] also listed.  To call with correct one.

## 2018-07-13 NOTE — Telephone Encounter (Signed)
Received two requests from [facility] in [place].  [facility] in [place].  They do have on file refills for one year.  Pt aware.  She states it's not the same.  Adv it's the same drug just diff name.

## 2018-08-22 ENCOUNTER — Other Ambulatory Visit: Payer: Self-pay

## 2018-08-22 ENCOUNTER — Telehealth: Payer: Self-pay

## 2018-08-22 MED ORDER — NORGESTIMATE-ETH ESTRADIOL 0.25-35 MG-MCG PO TABS: 1.0000 | ORAL_TABLET | Freq: Every day | ORAL | 11 refills | Status: DC

## 2018-08-22 NOTE — Telephone Encounter (Signed)
Yes call in an extra pack and tell her to use back up birth control x 1 week

## 2018-08-22 NOTE — Telephone Encounter (Signed)
Pt calling today states that her kids threw her OCP's away while cleaning up the kitchen. Please advise if its okay to refill for her

## 2018-08-22 NOTE — Telephone Encounter (Signed)
Pt aware.

## 2019-03-29 ENCOUNTER — Telehealth: Payer: Self-pay

## 2019-03-29 NOTE — Telephone Encounter (Signed)
Fine with me, thanks. 

## 2019-03-29 NOTE — Telephone Encounter (Signed)
Copied from [place] [id number]. Topic: Appointment Scheduling - Transfer of Care >> Mar 29, 2019  4:08 PM [name] wrote: Pt is requesting to transfer FROM: Kimberly Holder Pt is requesting to transfer TO: Dr. Aundra Dubin Reason for requested transfer: Location  Send CRM to patient's current PCP (transferring FROM).

## 2019-03-29 NOTE — Telephone Encounter (Signed)
[place] with me schedule patient please   [place]

## 2019-04-05 ENCOUNTER — Other Ambulatory Visit: Payer: Self-pay

## 2019-04-09 ENCOUNTER — Other Ambulatory Visit: Payer: Self-pay

## 2019-04-09 ENCOUNTER — Ambulatory Visit (INDEPENDENT_AMBULATORY_CARE_PROVIDER_SITE_OTHER): Payer: 59 | Admitting: Internal Medicine

## 2019-04-09 ENCOUNTER — Encounter: Payer: Self-pay | Admitting: Internal Medicine

## 2019-04-09 VITALS — BP 118/76 | HR 79 | Temp 97.9°F | Ht 61.0 in | Wt 181.0 lb

## 2019-04-09 DIAGNOSIS — Z1389 Encounter for screening for other disorder: Secondary | ICD-10-CM | POA: Diagnosis not present

## 2019-04-09 DIAGNOSIS — Z1329 Encounter for screening for other suspected endocrine disorder: Secondary | ICD-10-CM

## 2019-04-09 DIAGNOSIS — Z0000 Encounter for general adult medical examination without abnormal findings: Secondary | ICD-10-CM

## 2019-04-09 DIAGNOSIS — E669 Obesity, unspecified: Secondary | ICD-10-CM | POA: Insufficient documentation

## 2019-04-09 DIAGNOSIS — E282 Polycystic ovarian syndrome: Secondary | ICD-10-CM

## 2019-04-09 DIAGNOSIS — E66811 Obesity, class 1: Secondary | ICD-10-CM | POA: Insufficient documentation

## 2019-04-09 DIAGNOSIS — Z13220 Encounter for screening for lipoid disorders: Secondary | ICD-10-CM

## 2019-04-09 DIAGNOSIS — E559 Vitamin D deficiency, unspecified: Secondary | ICD-10-CM

## 2019-04-09 DIAGNOSIS — R87610 Atypical squamous cells of undetermined significance on cytologic smear of cervix (ASC-US): Secondary | ICD-10-CM | POA: Insufficient documentation

## 2019-04-09 NOTE — Patient Instructions (Signed)
Take 4 months and 3 months off have to see me in 2 months if starting   Phentermine tablets or capsules=adipex  What is this medicine? PHENTERMINE (FEN ter meen) decreases your appetite. It is used with a reduced calorie diet and exercise to help you lose weight. This medicine may be used for other purposes; ask your health care provider or pharmacist if you have questions. COMMON BRAND NAME(S): Adipex-P, Atti-Plex P, Atti-Plex P Spansule, Fastin, Lomaira, Pro-Fast, Tara-8 What should I tell my health care provider before I take this medicine? They need to know if you have any of these conditions:  agitation or nervousness  diabetes  glaucoma  heart disease  high blood pressure  history of drug abuse or addiction  history of stroke  kidney disease  lung disease called Primary Pulmonary Hypertension (PPH)  taken an MAOI like Carbex, Eldepryl, Marplan, Nardil, or Parnate in last 14 days  taking stimulant medicines for attention disorders, weight loss, or to stay awake  thyroid disease  an unusual or allergic reaction to phentermine, other medicines, foods, dyes, or preservatives  pregnant or trying to get pregnant  breast-feeding How should I use this medicine? Take this medicine by mouth with a glass of water. Follow the directions on the prescription label. The instructions for use may differ based on the product and dose you are taking. Avoid taking this medicine in the evening. It may interfere with sleep. Take your doses at regular intervals. Do not take your medicine more often than directed. Talk to your pediatrician regarding the use of this medicine in children. While this drug may be prescribed for children 17 years or older for selected conditions, precautions do apply. Overdosage: If you think you have taken too much of this medicine contact a poison control center or emergency room at once. NOTE: This medicine is only for you. Do not share this medicine with  others. What if I miss a dose? If you miss a dose, take it as soon as you can. If it is almost time for your next dose, take only that dose. Do not take double or extra doses. What may interact with this medicine? Do not take this medicine with any of the following medications:  MAOIs like Carbex, Eldepryl, Marplan, Nardil, and Parnate  medicines for colds or breathing difficulties like pseudoephedrine or phenylephrine  procarbazine  sibutramine  stimulant medicines for attention disorders, weight loss, or to stay awake This medicine may also interact with the following medications:  certain medicines for depression, anxiety, or psychotic disturbances  linezolid  medicines for diabetes  medicines for high blood pressure This list may not describe all possible interactions. Give your health care provider a list of all the medicines, herbs, non-prescription drugs, or dietary supplements you use. Also tell them if you smoke, drink alcohol, or use illegal drugs. Some items may interact with your medicine. What should I watch for while using this medicine? Notify your physician immediately if you become short of breath while doing your normal activities. Do not take this medicine within 6 hours of bedtime. It can keep you from getting to sleep. Avoid drinks that contain caffeine and try to stick to a regular bedtime every night. This medicine was intended to be used in addition to a healthy diet and exercise. The best results are achieved this way. This medicine is only indicated for short-term use. Eventually your weight loss may level out. At that point, the drug will only help you maintain your  new weight. Do not increase or in any way change your dose without consulting your doctor. You may get drowsy or dizzy. Do not drive, use machinery, or do anything that needs mental alertness until you know how this medicine affects you. Do not stand or sit up quickly, especially if you are an older  patient. This reduces the risk of dizzy or fainting spells. Alcohol may increase dizziness and drowsiness. Avoid alcoholic drinks. What side effects may I notice from receiving this medicine? Side effects that you should report to your doctor or health care professional as soon as possible:  allergic reactions like skin rash, itching or hives, swelling of the face, lips, or tongue)  anxiety  breathing problems  changes in vision  chest pain or chest tightness  depressed mood or other mood changes  hallucinations, loss of contact with reality  fast, irregular heartbeat  increased blood pressure  irritable  nervousness or restlessness  painful urination  palpitations  tremors  trouble sleeping  seizures  signs and symptoms of a stroke like changes in vision; confusion; trouble speaking or understanding; severe headaches; sudden numbness or weakness of the face, arm or leg; trouble walking; dizziness; loss of balance or coordination  unusually weak or tired  vomiting Side effects that usually do not require medical attention (report to your doctor or health care professional if they continue or are bothersome):  constipation or diarrhea  dry mouth  headache  nausea  stomach upset  sweating This list may not describe all possible side effects. Call your doctor for medical advice about side effects. You may report side effects to FDA at 1-800-FDA-1088. Where should I keep my medicine? Keep out of the reach of children. This medicine can be abused. Keep your medicine in a safe place to protect it from theft. Do not share this medicine with anyone. Selling or giving away this medicine is dangerous and against the law. This medicine may cause accidental overdose and death if taken by other adults, children, or pets. Mix any unused medicine with a substance like cat litter or coffee grounds. Then throw the medicine away in a sealed container like a sealed bag or a  coffee can with a lid. Do not use the medicine after the expiration date. Store at room temperature between 20 and 25 degrees C (68 and 77 degrees F). Keep container tightly closed. NOTE: This sheet is a summary. It may not cover all possible information. If you have questions about this medicine, talk to your doctor, pharmacist, or health care provider.  2020 Elsevier/Gold Standard (2016-12-02 08:23:13)  Metformin tablets What is this medicine? METFORMIN (met FOR min) is used to treat type 2 diabetes. It helps to control blood sugar. Treatment is combined with diet and exercise. This medicine can be used alone or with other medicines for diabetes. This medicine may be used for other purposes; ask your health care provider or pharmacist if you have questions. COMMON BRAND NAME(S): Glucophage What should I tell my health care provider before I take this medicine? They need to know if you have any of these conditions:  anemia  dehydration  heart disease  frequently drink alcohol-containing beverages  kidney disease  liver disease  polycystic ovary syndrome  serious infection or injury  vomiting  an unusual or allergic reaction to metformin, other medicines, foods, dyes, or preservatives  pregnant or trying to get pregnant  breast-feeding How should I use this medicine? Take this medicine by mouth with a glass  of water. Follow the directions on the prescription label. Take this medicine with food. Take your medicine at regular intervals. Do not take your medicine more often than directed. Do not stop taking except on your doctor's advice. Talk to your pediatrician regarding the use of this medicine in children. While this drug may be prescribed for children as young as 15 years of age for selected conditions, precautions do apply. Overdosage: If you think you have taken too much of this medicine contact a poison control center or emergency room at once. NOTE: This medicine is  only for you. Do not share this medicine with others. What if I miss a dose? If you miss a dose, take it as soon as you can. If it is almost time for your next dose, take only that dose. Do not take double or extra doses. What may interact with this medicine? Do not take this medicine with any of the following medications:  certain contrast medicines given before X-rays, CT scans, MRI, or other procedures  dofetilide This medicine may also interact with the following medications:  acetazolamide  alcohol  certain antivirals for HIV or hepatitis  certain medicines for blood pressure, heart disease, irregular heart beat  cimetidine  dichlorphenamide  digoxin  diuretics  female hormones, like estrogens or progestins and birth control pills  glycopyrrolate  isoniazid  lamotrigine  memantine  methazolamide  metoclopramide  midodrine  niacin  phenothiazines like chlorpromazine, mesoridazine, prochlorperazine, thioridazine  phenytoin  ranolazine  steroid medicines like prednisone or cortisone  stimulant medicines for attention disorders, weight loss, or to stay awake  thyroid medicines  topiramate  trospium  vandetanib  zonisamide This list may not describe all possible interactions. Give your health care provider a list of all the medicines, herbs, non-prescription drugs, or dietary supplements you use. Also tell them if you smoke, drink alcohol, or use illegal drugs. Some items may interact with your medicine. What should I watch for while using this medicine? Visit your doctor or health care professional for regular checks on your progress. A test called the HbA1C (A1C) will be monitored. This is a simple blood test. It measures your blood sugar control over the last 2 to 3 months. You will receive this test every 3 to 6 months. Learn how to check your blood sugar. Learn the symptoms of low and high blood sugar and how to manage them. Always carry a  quick-source of sugar with you in case you have symptoms of low blood sugar. Examples include hard sugar candy or glucose tablets. Make sure others know that you can choke if you eat or drink when you develop serious symptoms of low blood sugar, such as seizures or unconsciousness. They must get medical help at once. Tell your doctor or health care professional if you have high blood sugar. You might need to change the dose of your medicine. If you are sick or exercising more than usual, you might need to change the dose of your medicine. Do not skip meals. Ask your doctor or health care professional if you should avoid alcohol. Many nonprescription cough and cold products contain sugar or alcohol. These can affect blood sugar. This medicine may cause ovulation in premenopausal women who do not have regular monthly periods. This may increase your chances of becoming pregnant. You should not take this medicine if you become pregnant or think you may be pregnant. Talk with your doctor or health care professional about your birth control options while taking this  medicine. Contact your doctor or health care professional right away if you think you are pregnant. If you are going to need surgery, a MRI, CT scan, or other procedure, tell your doctor that you are taking this medicine. You may need to stop taking this medicine before the procedure. Wear a medical ID bracelet or chain, and carry a card that describes your disease and details of your medicine and dosage times. This medicine may cause a decrease in folic acid and vitamin B12. You should make sure that you get enough vitamins while you are taking this medicine. Discuss the foods you eat and the vitamins you take with your health care professional. What side effects may I notice from receiving this medicine? Side effects that you should report to your doctor or health care professional as soon as possible:  allergic reactions like skin rash, itching or  hives, swelling of the face, lips, or tongue  breathing problems  feeling faint or lightheaded, falls  muscle aches or pains  signs and symptoms of low blood sugar such as feeling anxious, confusion, dizziness, increased hunger, unusually weak or tired, sweating, shakiness, cold, irritable, headache, blurred vision, fast heartbeat, loss of consciousness  slow or irregular heartbeat  unusual stomach pain or discomfort  unusually tired or weak Side effects that usually do not require medical attention (report to your doctor or health care professional if they continue or are bothersome):  diarrhea  headache  heartburn  metallic taste in mouth  nausea  stomach gas, upset This list may not describe all possible side effects. Call your doctor for medical advice about side effects. You may report side effects to FDA at 1-800-FDA-1088. Where should I keep my medicine? Keep out of the reach of children. Store at room temperature between 15 and 30 degrees C (59 and 86 degrees F). Protect from moisture and light. Throw away any unused medicine after the expiration date. NOTE: This sheet is a summary. It may not cover all possible information. If you have questions about this medicine, talk to your doctor, pharmacist, or health care provider.  2020 Elsevier/Gold Standard (2017-07-27 19:15:19)  Polycystic Ovarian Syndrome  Polycystic ovarian syndrome (PCOS) is a common hormonal disorder among women of reproductive age. In most women with PCOS, many small fluid-filled sacs (cysts) grow on the ovaries, and the cysts are not part of a normal menstrual cycle. PCOS can cause problems with your menstrual periods and make it difficult to get pregnant. It can also cause an increased risk of miscarriage with pregnancy. If it is not treated, PCOS can lead to serious health problems, such as diabetes and heart disease. What are the causes? The cause of PCOS is not known, but it may be the result of  a combination of certain factors, such as:  Irregular menstrual cycle.  High levels of certain hormones (androgens).  Problems with the hormone that helps to control blood sugar (insulin resistance).  Certain genes. What increases the risk? This condition is more likely to develop in women who have a family history of PCOS. What are the signs or symptoms? Symptoms of PCOS may include:  Multiple ovarian cysts.  Infrequent periods or no periods.  Periods that are too frequent or too heavy.  Unpredictable periods.  Inability to get pregnant (infertility) because of not ovulating.  Increased growth of hair on the face, chest, stomach, back, thumbs, thighs, or toes.  Acne or oily skin. Acne may develop during adulthood, and it may not respond to  treatment.  Pelvic pain.  Weight gain or obesity.  Patches of thickened and dark brown or black skin on the neck, arms, breasts, or thighs (acanthosis nigricans).  Excess hair growth on the face, chest, abdomen, or upper thighs (hirsutism). How is this diagnosed? This condition is diagnosed based on:  Your medical history.  A physical exam, including a pelvic exam. Your health care provider may look for areas of increased hair growth on your skin.  Tests, such as: ? Ultrasound. This may be used to examine the ovaries and the lining of the uterus (endometrium) for cysts. ? Blood tests. These may be used to check levels of sugar (glucose), female hormone (testosterone), and female hormones (estrogen and progesterone) in your blood. How is this treated? There is no cure for PCOS, but treatment can help to manage symptoms and prevent more health problems from developing. Treatment varies depending on:  Your symptoms.  Whether you want to have a baby or whether you need birth control (contraception). Treatment may include nutrition and lifestyle changes along with:  Progesterone hormone to start a menstrual period.  Birth control  pills to help you have regular menstrual periods.  Medicines to make you ovulate, if you want to get pregnant.  Medicine to reduce excessive hair growth.  Surgery, in severe cases. This may involve making small holes in one or both of your ovaries. This decreases the amount of testosterone that your body produces. Follow these instructions at home:  Take over-the-counter and prescription medicines only as told by your health care provider.  Follow a healthy meal plan. This can help you reduce the effects of PCOS. ? Eat a healthy diet that includes lean proteins, complex carbohydrates, fresh fruits and vegetables, low-fat dairy products, and healthy fats. Make sure to eat enough fiber.  If you are overweight, lose weight as told by your health care provider. ? Losing 10% of your body weight may improve symptoms. ? Your health care provider can determine how much weight loss is best for you and can help you lose weight safely.  Keep all follow-up visits as told by your health care provider. This is important. Contact a health care provider if:  Your symptoms do not get better with medicine.  You develop new symptoms. This information is not intended to replace advice given to you by your health care provider. Make sure you discuss any questions you have with your health care provider. Document Released: 10/14/2004 Document Revised: 06/02/2017 Document Reviewed: 12/06/2015 Elsevier Patient Education  2020 [place] for Polycystic Ovary Syndrome Polycystic ovary syndrome (PCOS) is a disorder of the chemicals (hormones) that regulate a woman's reproductive system, including monthly periods (menstruation). The condition causes important hormones to be out of balance. PCOS can:  Stop your periods or make them irregular.  Cause cysts to develop on your ovaries.  Make it difficult to get pregnant.  Stop your body from responding to the effects of insulin (insulin resistance).  Insulin resistance can lead to obesity and diabetes. Changing what you eat can help you manage PCOS and improve your health. Following a balanced diet can help you lose weight and improve the way that your body uses insulin. What are tips for following this plan?  Follow a balanced diet for meals and snacks. Eat breakfast, lunch, dinner, and one or two snacks every day.  Include protein in each meal and snack.  Choose whole grains instead of products that are made with refined flour.  Eat  a variety of foods.  Exercise regularly as told by your health care provider. Aim to do 30 or more minutes of exercise on most days of the week.  If you are overweight or obese: ? Pay attention to how many calories you eat. Cutting down on calories can help you lose weight. ? Work with your health care provider or a diet and nutrition specialist (dietitian) to figure out how many calories you need each day. What foods can I eat?  Fruits Include a variety of colors and types. All fruits are helpful for PCOS. Vegetables Include a variety of colors and types. All vegetables are helpful for PCOS. Grains Whole grains, such as whole wheat. Whole-grain breads, crackers, cereals, and pasta. Unsweetened oatmeal, bulgur, barley, quinoa, and brown rice. Tortillas made from corn or whole-wheat flour. Meats and other proteins Low-fat (lean) proteins, such as fish, chicken, beans, eggs, and tofu. Dairy Low-fat dairy products, such as skim milk, cheese sticks, and yogurt. Beverages Low-fat or fat-free drinks, such as water, low-fat milk, sugar-free drinks, and small amounts of 100% fruit juice. Seasonings and condiments Ketchup. Mustard. Barbecue sauce. Relish. Low-fat or fat-free mayonnaise. Fats and oils Olive oil or canola oil. Walnuts and almonds. The items listed above may not be a complete list of recommended foods and beverages. Contact a dietitian for more options. What foods are not recommended? Foods  that are high in calories or fat. Fried foods. Sweets. Products that are made from refined white flour, including white bread, pastries, white rice, and pasta. The items listed above may not be a complete list of foods and beverages to avoid. Contact a dietitian for more information. Summary  PCOS is a hormonal imbalance that affects a woman's reproductive system.  You can help to manage your PCOS by exercising regularly and eating a healthy, varied diet of vegetables, fruit, whole grains, low-fat (lean) protein, and low-fat dairy products.  Changing what you eat can improve the way that your body uses insulin, help your hormones reach normal levels, and help you lose weight. This information is not intended to replace advice given to you by your health care provider. Make sure you discuss any questions you have with your health care provider. Document Released: 10/12/2015 Document Revised: 10/10/2018 Document Reviewed: 04/24/2017 Elsevier Patient Education  2020 [employer].

## 2019-04-09 NOTE — Progress Notes (Signed)
Chief Complaint  Patient presents with  . Transitions Of Care  . Annual Exam   Annual work form for [employer] needed to be filled out no complaints today  1. Obesity BMI 34.2 disc healthy diet and exercise she reports since been on OCP had weight gain and also with dx of PCOS  2. PCOS f/u ob/gyn upcoming 06/2019 and h/o ascus pap negative HPV    Review of Systems  Constitutional: Negative for weight loss.  HENT: Negative for hearing loss.   Eyes: Negative for blurred vision.  Respiratory: Negative for shortness of breath.   Cardiovascular: Negative for chest pain.  Gastrointestinal: Negative for abdominal pain.  Musculoskeletal: Negative for falls.  Skin: Negative for rash.  Neurological: Negative for headaches.  Psychiatric/Behavioral: Negative for depression.   Past Medical History:  Diagnosis Date  . Atypical squamous cells of undetermined significance (ASCUS) on Papanicolaou smear of cervix   . Cervical high risk HPV (human papillomavirus) test positive 2014  . Obesity (BMI 30-39.9)   . PCOS (polycystic ovarian syndrome)    Past Surgical History:  Procedure Laterality Date  . CESAREAN SECTION    . COLPOSCOPY W/ BIOPSY / CURETTAGE  08/01/2014   BX Neg  . TONSILLECTOMY AND ADENOIDECTOMY  1996   Family History  Problem Relation Age of Onset  . Arthritis Mother   . Diabetes Mother        also had a pituitary adenoma  . Endocrine tumor Mother        cancerous pituitary tumor per pt   . Hypertension Father   . Other Father        benign "neck" tumor  . Arthritis Brother   . Diabetes Maternal Grandmother   . Heart disease Maternal Grandmother    Social History   Socioeconomic History  . Marital status: Married    Spouse name: Briscoe Deutscher "Gerald Stabs"  . Number of children: 2  . Years of education: Not on file  . Highest education level: Not on file  Occupational History  . 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 Financial resource strain: Not on file  . Food  insecurity    Worry: Not on file    Inability: Not on file  . Transportation needs    Medical: Not on file    Non-medical: Not on file  Tobacco Use  . Smoking status: Never Smoker  . Smokeless tobacco: Never Used  Substance and Sexual Activity  . Alcohol use: No    Alcohol/week: 0.0 standard drinks  . Drug use: No  . Sexual activity: Yes  Lifestyle  . Physical activity    Days per week: 6 days    Minutes per session: 20 min  . Stress: Not at all  Relationships  . Social connections    Talks on phone: More than three times a week    Gets together: Three times a week    Attends religious service: Never    Active member of club or organization: No    Attends meetings of clubs or organizations: Never    Relationship status: Married  . Intimate partner violence    Fear of current or ex partner: No    Emotionally abused: No    Physically abused: No    Forced sexual activity: No  Other Topics Concern  . Not on file  Social History Narrative   Married.   2 children.    Works for [employer], [employer] (former), [employer].   Enjoys spending time with  family.   Never smoker    No outpatient medications have been marked as taking for the 04/09/19 encounter (Office Visit) with McLean-Scocuzza, Pasty Spillers, MD.   No Known Allergies No results found for this or any previous visit (from the past 2160 hour(s)). Objective  Body mass index is 34.2 kg/m. Wt Readings from Last 3 Encounters:  04/09/19 181 lb (82.1 kg)  06/22/18 177 lb (80.3 kg)  05/30/18 179 lb 4 oz (81.3 kg)   Temp Readings from Last 3 Encounters:  04/09/19 97.9 F (36.6 C) (Oral)  05/30/18 98.2 F (36.8 C) (Oral)  02/09/17 98 F (36.7 C) (Oral)   BP Readings from Last 3 Encounters:  04/09/19 118/76  06/22/18 110/70  05/30/18 114/70   Pulse Readings from Last 3 Encounters:  04/09/19 79  05/30/18 76  06/21/17 97    Physical Exam Vitals signs and nursing note reviewed.  Constitutional:       Appearance: Normal appearance. She is well-developed and well-groomed. She is obese.     Comments: +mask on    HENT:     Head: Normocephalic and atraumatic.  Eyes:     Conjunctiva/sclera: Conjunctivae normal.     Pupils: Pupils are equal, round, and reactive to light.  Cardiovascular:     Rate and Rhythm: Normal rate and regular rhythm.     Heart sounds: Normal heart sounds. No murmur.  Pulmonary:     Effort: Pulmonary effort is normal.     Breath sounds: Normal breath sounds.  Skin:    General: Skin is warm and dry.  Neurological:     General: No focal deficit present.     Mental Status: She is alert and oriented to person, place, and time. Mental status is at baseline.     Gait: Gait normal.  Psychiatric:        Attention and Perception: Attention and perception normal.        Mood and Affect: Mood and affect normal.        Speech: Speech normal.        Behavior: Behavior normal. Behavior is cooperative.        Thought Content: Thought content normal.        Cognition and Memory: Cognition and memory normal.        Judgment: Judgment normal.     Assessment  Plan  Annual physical exam - Plan:  sch fasting labs asap with flu shot and A1C  Flu shot needed  Td had 12/31/15 Pap neg HPV but ASCUS + 06/2018 ob/gyn f/u 06/2019  Mammogram had BL 35/6 screening due age 79 y.o  rec healthy diet and exercise   Form for work filled out with labs resulted  rec healthy diet and exercise   PCOS (polycystic ovarian syndrome) - f/u ob/gyn disc metformin/adipex    Obesity (BMI 30-39.9)  -see above rec healthy diet and exercis e  Provider: Dr. French Ana McLean-Scocuzza-Internal Medicine

## 2019-04-09 NOTE — Progress Notes (Signed)
Pre visit review using our clinic review tool, if applicable. No additional management support is needed unless otherwise documented below in the visit note. 

## 2019-04-19 ENCOUNTER — Other Ambulatory Visit (INDEPENDENT_AMBULATORY_CARE_PROVIDER_SITE_OTHER): Payer: 59

## 2019-04-19 ENCOUNTER — Other Ambulatory Visit: Payer: Self-pay

## 2019-04-19 DIAGNOSIS — Z1329 Encounter for screening for other suspected endocrine disorder: Secondary | ICD-10-CM

## 2019-04-19 DIAGNOSIS — E559 Vitamin D deficiency, unspecified: Secondary | ICD-10-CM | POA: Diagnosis not present

## 2019-04-19 DIAGNOSIS — E282 Polycystic ovarian syndrome: Secondary | ICD-10-CM | POA: Diagnosis not present

## 2019-04-19 DIAGNOSIS — Z13220 Encounter for screening for lipoid disorders: Secondary | ICD-10-CM

## 2019-04-19 DIAGNOSIS — Z1389 Encounter for screening for other disorder: Secondary | ICD-10-CM | POA: Diagnosis not present

## 2019-04-19 DIAGNOSIS — Z0000 Encounter for general adult medical examination without abnormal findings: Secondary | ICD-10-CM

## 2019-04-19 LAB — URINALYSIS, ROUTINE W REFLEX MICROSCOPIC
Bilirubin Urine: NEGATIVE
Hgb urine dipstick: NEGATIVE
Ketones, ur: NEGATIVE
Leukocytes,Ua: NEGATIVE
Nitrite: NEGATIVE
RBC / HPF: NONE SEEN (ref 0–?)
Specific Gravity, Urine: 1.025 (ref 1.000–1.030)
Total Protein, Urine: NEGATIVE
Urine Glucose: NEGATIVE
Urobilinogen, UA: 0.2 (ref 0.0–1.0)
WBC, UA: NONE SEEN (ref 0–?)
pH: 6 (ref 5.0–8.0)

## 2019-04-19 LAB — VITAMIN D 25 HYDROXY (VIT D DEFICIENCY, FRACTURES): VITD: 31.98 ng/mL (ref 30.00–100.00)

## 2019-04-19 LAB — TSH: TSH: 0.63 u[IU]/mL (ref 0.35–4.50)

## 2019-04-19 LAB — CBC WITH DIFFERENTIAL/PLATELET
Basophils Absolute: 0.1 10*3/uL (ref 0.0–0.1)
Basophils Relative: 0.9 % (ref 0.0–3.0)
Eosinophils Absolute: 0.1 10*3/uL (ref 0.0–0.7)
Eosinophils Relative: 1.8 % (ref 0.0–5.0)
HCT: 41.5 % (ref 36.0–46.0)
Hemoglobin: 14.4 g/dL (ref 12.0–15.0)
Lymphocytes Relative: 30.1 % (ref 12.0–46.0)
Lymphs Abs: 2.1 10*3/uL (ref 0.7–4.0)
MCHC: 34.7 g/dL (ref 30.0–36.0)
MCV: 86.9 fl (ref 78.0–100.0)
Monocytes Absolute: 0.5 10*3/uL (ref 0.1–1.0)
Monocytes Relative: 6.5 % (ref 3.0–12.0)
Neutro Abs: 4.2 10*3/uL (ref 1.4–7.7)
Neutrophils Relative %: 60.7 % (ref 43.0–77.0)
Platelets: 270 10*3/uL (ref 150.0–400.0)
RBC: 4.78 Mil/uL (ref 3.87–5.11)
RDW: 12.6 % (ref 11.5–15.5)
WBC: 7 10*3/uL (ref 4.0–10.5)

## 2019-04-19 LAB — COMPREHENSIVE METABOLIC PANEL
ALT: 20 U/L (ref 0–35)
AST: 17 U/L (ref 0–37)
Albumin: 4.3 g/dL (ref 3.5–5.2)
Alkaline Phosphatase: 89 U/L (ref 39–117)
BUN: 15 mg/dL (ref 6–23)
CO2: 28 mEq/L (ref 19–32)
Calcium: 9.2 mg/dL (ref 8.4–10.5)
Chloride: 103 mEq/L (ref 96–112)
Creatinine, Ser: 0.75 mg/dL (ref 0.40–1.20)
GFR: 86.07 mL/min (ref >60.00)
Glucose, Bld: 88 mg/dL (ref 70–99)
Potassium: 4.2 mEq/L (ref 3.5–5.1)
Sodium: 138 mEq/L (ref 135–145)
Total Bilirubin: 0.7 mg/dL (ref 0.2–1.2)
Total Protein: 7.6 g/dL (ref 6.0–8.3)

## 2019-04-19 LAB — LIPID PANEL
Cholesterol: 175 mg/dL (ref 0–200)
HDL: 52.5 mg/dL (ref >39.00)
LDL Cholesterol: 112 mg/dL — ABNORMAL HIGH (ref 0–99)
NonHDL: 122.04
Total CHOL/HDL Ratio: 3
Triglycerides: 51 mg/dL (ref 0.0–149.0)
VLDL: 10.2 mg/dL (ref 0.0–40.0)

## 2019-04-19 LAB — HEMOGLOBIN A1C: Hgb A1c MFr Bld: 5.2 % (ref 4.6–6.5)

## 2019-04-23 ENCOUNTER — Encounter: Payer: Self-pay | Admitting: Internal Medicine

## 2019-04-24 ENCOUNTER — Other Ambulatory Visit: Payer: Self-pay | Admitting: Internal Medicine

## 2019-04-24 DIAGNOSIS — E669 Obesity, unspecified: Secondary | ICD-10-CM

## 2019-04-24 MED ORDER — PHENTERMINE HCL 37.5 MG PO TABS: 37.5000 mg | ORAL_TABLET | Freq: Every day | ORAL | 0 refills | Status: DC

## 2019-06-18 ENCOUNTER — Other Ambulatory Visit: Payer: Self-pay | Admitting: Internal Medicine

## 2019-06-18 ENCOUNTER — Telehealth: Payer: Self-pay | Admitting: Internal Medicine

## 2019-06-18 DIAGNOSIS — E669 Obesity, unspecified: Secondary | ICD-10-CM

## 2019-06-18 MED ORDER — PHENTERMINE HCL 37.5 MG PO TABS: 37.5000 mg | ORAL_TABLET | Freq: Every day | ORAL | 0 refills | Status: DC

## 2019-06-18 NOTE — Telephone Encounter (Signed)
What is current weight and height?   After these 2 months she will need to take a 3 month break and sch f/u before more refills of this  Sch f/u in 5 months   [place]

## 2019-06-18 NOTE — Telephone Encounter (Signed)
Pt is wanting to get a refill on Phentermine. She states medication is working for her and she didn't know if she needed a follow up appt in order to get this medication refilled. I was going to schedule a follow up virtual visit but the only appt besides same day appts available is 12/18 @ 3:30 but she can't do this day or time. The next appt available is in 08/23/19. Please advise.

## 2019-06-19 NOTE — Telephone Encounter (Signed)
Ok to sch appt 08/23/19 to f/u

## 2019-06-19 NOTE — Telephone Encounter (Signed)
Left message for patient to return call back.  

## 2019-06-19 NOTE — Telephone Encounter (Signed)
Current height and weight is 158lb 61in. Patient has been informed.

## 2019-06-20 NOTE — Telephone Encounter (Signed)
Left patient a message to return call and schedule follow up appt in Feb with Dr. Olivia Mackie.

## 2019-06-21 NOTE — Telephone Encounter (Signed)
Pt called back and scheduled appt for 08/16/2019.

## 2019-06-23 DIAGNOSIS — U071 COVID-19: Secondary | ICD-10-CM

## 2019-06-23 HISTORY — DX: COVID-19: U07.1

## 2019-06-24 ENCOUNTER — Other Ambulatory Visit: Payer: 59

## 2019-06-25 ENCOUNTER — Ambulatory Visit: Payer: 59 | Admitting: Certified Nurse Midwife

## 2019-07-13 ENCOUNTER — Other Ambulatory Visit: Payer: Self-pay | Admitting: Certified Nurse Midwife

## 2019-07-26 ENCOUNTER — Ambulatory Visit (INDEPENDENT_AMBULATORY_CARE_PROVIDER_SITE_OTHER): Payer: 59 | Admitting: Certified Nurse Midwife

## 2019-07-26 ENCOUNTER — Other Ambulatory Visit (HOSPITAL_COMMUNITY)
Admission: RE | Admit: 2019-07-26 | Discharge: 2019-07-26 | Disposition: A | Discharge status: Home / Self Care | Payer: 59 | Source: Ambulatory Visit | Attending: Certified Nurse Midwife | Admitting: Certified Nurse Midwife

## 2019-07-26 ENCOUNTER — Other Ambulatory Visit: Payer: Self-pay

## 2019-07-26 ENCOUNTER — Encounter: Payer: Self-pay | Admitting: Certified Nurse Midwife

## 2019-07-26 VITALS — BP 100/80 | HR 85 | Ht 61.0 in | Wt 160.0 lb

## 2019-07-26 DIAGNOSIS — R87610 Atypical squamous cells of undetermined significance on cytologic smear of cervix (ASC-US): Secondary | ICD-10-CM

## 2019-07-26 DIAGNOSIS — Z1151 Encounter for screening for human papillomavirus (HPV): Secondary | ICD-10-CM | POA: Insufficient documentation

## 2019-07-26 DIAGNOSIS — Z124 Encounter for screening for malignant neoplasm of cervix: Secondary | ICD-10-CM | POA: Diagnosis not present

## 2019-07-26 DIAGNOSIS — Z01419 Encounter for gynecological examination (general) (routine) without abnormal findings: Secondary | ICD-10-CM | POA: Diagnosis not present

## 2019-07-26 DIAGNOSIS — Z8742 Personal history of other diseases of the female genital tract: Secondary | ICD-10-CM | POA: Insufficient documentation

## 2019-07-26 DIAGNOSIS — Z3009 Encounter for other general counseling and advice on contraception: Secondary | ICD-10-CM

## 2019-07-26 MED ORDER — DROSPIRENONE-ETHINYL ESTRADIOL 3-0.02 MG PO TABS: 1.0000 | ORAL_TABLET | Freq: Every day | ORAL | 11 refills | Status: DC

## 2019-07-26 NOTE — Progress Notes (Signed)
Gynecology Annual Exam  PCP: McLean-Scocuzza, Nino Glow, MD  Chief Complaint:  Chief Complaint  Patient presents with  . Gynecologic Exam    is fasting today in case blood draw is needed; primary gave her wt loss pill; has had covid - lost taste and smell during Christmas,    History of Present Illness:Kimberly Holder is a 39 year old Caucasian/White female, [place], who presents for her annual exam.  Her menses are regular on the pills and her LMP was 07/05/2019. They occur every month, they last 5 days, are medium flow with one heavier day requiring pad change every 3-4 hours. She denies dysmenorrhea. She did not take her pills in October and November to see if she could lose more weight off her pills. She used condoms instead. She has had no spotting.  She reports fatigue and headache prior to menses, but rarely uses OTC medications but occasionally uses Advil for headaches The patient's past medical history is notable for  positive high risk HPV on her Pap smears since 2014. Had a colposcopy done in 2016 and had negative biopsies. She has had 2 normal Pap smears in the last 3 years. Her last Pap smear 06/22/2018 was ASCUS with negative HRHPV. Since her last annual GYN exam dated 06/22/2018 she has lost 21# using phentermine and diet changes.She and her son had Covid 69 virus in December. She had a negative Covid after recovering on December 28. Currently she has a tender lesion on her right nasal septum She is sexually active. She is currently using Orthocyclen for contraception.  Her most recent pap smear was obtained 06/22/2018 and was ASCUS/negative Her most recent mammogram obtained on 06/16/2015 was normal.  There is no family history of breast cancer.  There is no family history of ovarian cancer.  The patient does do self breast exams every month.  The patient does not smoke.  The patient does not drink alcohol.  The patient does not use illegal drugs.  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 Current BMI is 30.25  kg/m2. The patient does get adequate calcium in her diet.  She has had a recent cholesterol screen in 2020 that was normal.    Review of Systems: Review of Systems  Constitutional: Positive for weight loss (intentional). Negative for chills and fever.  HENT: Negative for congestion, sinus pain and sore throat.        Positive for a tender lesion in her right nare  Eyes: Negative for blurred vision and pain.  Respiratory: Negative for hemoptysis, shortness of breath and wheezing.   Cardiovascular: Negative for chest pain, palpitations and leg swelling.  Gastrointestinal: Negative for abdominal pain, blood in stool, diarrhea, heartburn, nausea and vomiting.  Genitourinary: Negative for dysuria, frequency, hematuria and urgency.  Musculoskeletal: Negative for back pain, joint pain and myalgias.  Skin: Negative for itching and rash.  Neurological: Negative for dizziness, tingling and headaches.  Endo/Heme/Allergies: Negative for environmental allergies and polydipsia. Does not bruise/bleed easily.       Positive for hirsutism   Psychiatric/Behavioral: Negative for depression. The patient is not nervous/anxious and does not have insomnia.     Past Medical History:  Past Medical History:  Diagnosis Date  . Atypical squamous cells of undetermined significance (ASCUS) on Papanicolaou smear of cervix   . Cervical high risk HPV (human papillomavirus) test positive 2014  . COVID-19 06/23/2019  .  Obesity (BMI 30-39.9)   . PCOS (polycystic ovarian syndrome)   . Preeclampsia    during pregnancy    Past Surgical History:  Past Surgical History:  Procedure Laterality Date  . CESAREAN SECTION    . COLPOSCOPY W/ BIOPSY / CURETTAGE  08/01/2014   BX Neg  . TONSILLECTOMY AND ADENOIDECTOMY  1996    Family History:  Family History  Problem Relation Age of Onset  . Arthritis Mother   . Diabetes  Mother        also had a pituitary adenoma  . Endocrine tumor Mother        cancerous pituitary tumor per pt   . Cancer Mother 7       brain  . Hypertension Father   . Other Father        benign "neck" tumor  . Arthritis Brother   . Diabetes Maternal Grandmother   . Heart disease Maternal Grandmother    OB History  Gravida Para Term Preterm AB Living  4 2 2   2 2   SAB TAB Ectopic Multiple Live Births  1 1     2     # Outcome Date GA Lbr Len/2nd Weight Sex Delivery Anes PTL Lv  4 Term 08/10/11 [redacted]w[redacted]d  7 lb 10 oz (3.459 kg) M CS-LTranv   LIV  3 Term 01/26/06 [redacted]w[redacted]d  7 lb 4 oz (3.289 kg) F CS-LTranv   LIV     Complications: Fetal Intolerance, Failure to Progress in First Stage, Pregnancy induced hypertension  2 TAB      TAB     1 SAB      SAB      Social History:  Social History   Socioeconomic History  . Marital status: Married    Spouse name: 01/28/06 "[redacted]w[redacted]d"  . Number of children: 2  . Years of education: Not on file  . Highest education level: Not on file  Occupational History  . 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 Smoking status: Never Smoker  . Smokeless tobacco: Never Used  Substance and Sexual Activity  . Alcohol use: No    Alcohol/week: 0.0 standard drinks  . Drug use: No  . Sexual activity: Yes    Birth control/protection: Pill  Other Topics Concern  . Not on file  Social History Narrative   Married.   2 children.    Works for [employer], [employer] (former), [employer].   Enjoys spending time with family.   Never smoker    Social Determinants of [employer] Strain:   . Difficulty of Paying Living Expenses: Not on file  Food Insecurity:   . Worried About [employer] in the Last Year: Not on file  . Ran Out of Food in the Last Year: Not on file  Transportation Needs:   . Lack of Transportation (Medical): Not on file  . Lack of Transportation (Non-Medical): Not on file  Physical Activity:   . Days of Exercise per  Week: Not on file  . Minutes of Exercise per Session: Not on file  Stress:   . Feeling of Stress : Not on file  Social Connections:   . Frequency of Communication with Friends and Family: Not on file  . Frequency of Social Gatherings with Friends and Family: Not on file  . Attends Religious Services: Not on file  . Active Member of Clubs or Organizations: Not on file  . Attends Corporate investment banker Meetings: Not on file  .  Marital Status: Not on file  Intimate Partner Violence:   . Fear of Current or Ex-Partner: Not on file  . Emotionally Abused: Not on file  . Physically Abused: Not on file  . Sexually Abused: Not on file    Allergies:  No Known Allergies  Medications: Orthocyclen generic  Vitamin D3 2000 IU daily, vitamin C, elderberry gummy, vitamin B complex, iron, and Skin/ Nails/Hair vitamin Physical Exam Vitals: BP 100/80   Pulse 85   Ht 5\' 1"  (1.549 m)   Wt 160 lb (72.6 kg)   LMP 07/05/2019 (Exact Date)   BMI 30.23 kg/m   General: WF in NAD HEENT: normocephalic, anicteric Neck: no thyroid enlargement, no palpable nodules, no cervical lymphadenopathy  Pulmonary: No increased work of breathing, CTAB Cardiovascular: RRR, without murmur  Breast: Breast symmetrical, no tenderness, no palpable nodules or masses, no skin or nipple retraction present, no nipple discharge.  No axillary, infraclavicular or supraclavicular lymphadenopathy. Abdomen: Soft, non-tender, non-distended.  Umbilicus without lesions.  No hepatomegaly or masses palpable. No evidence of hernia. Dark hair growth midline abdomen to umbilicus Genitourinary:  External: Normal external female genitalia.  Normal urethral meatus, normal Bartholin's and Skene's glands.    Vagina: Normal vaginal mucosa, no evidence of prolapse.    Cervix: Grossly normal in appearance, no bleeding, non-tender  Uterus: Anteverted, normal size, shape, and consistency, mobile, and non-tender  Adnexa: No adnexal masses,  non-tender  Rectal: deferred  Lymphatic: no evidence of inguinal lymphadenopathy Extremities: no edema, erythema, or tenderness Neurologic: Grossly intact Psychiatric: mood appropriate, affect full     Assessment: 39 y.o. annual gyn exam History of abnormal Pap smears/ ASCUS Pap with negative HRHPV last year PCOS  Plan:  1) Breast cancer screening - recommend monthly self breast exam. Start annual mammograms at age 67.  2) Cervical cancer screening - Pap was done.   3) Contraception - discussed changing pill to a lower dose pill like YAZ. She would like to try lower dose pill. Yaz #1, RF x11. 4) Routine healthcare maintenance including cholesterol and diabetes screening managed by PCP  5) RTO 1 year and prn.    40, CNM

## 2019-07-30 LAB — CYTOLOGY - PAP
Comment: NEGATIVE
Diagnosis: NEGATIVE
Diagnosis: REACTIVE
High risk HPV: NEGATIVE

## 2019-08-16 ENCOUNTER — Encounter: Payer: Self-pay | Admitting: Internal Medicine

## 2019-08-16 ENCOUNTER — Ambulatory Visit (INDEPENDENT_AMBULATORY_CARE_PROVIDER_SITE_OTHER): Payer: 59 | Admitting: Internal Medicine

## 2019-08-16 ENCOUNTER — Other Ambulatory Visit: Payer: Self-pay

## 2019-08-16 VITALS — Ht 61.0 in | Wt 164.0 lb

## 2019-08-16 DIAGNOSIS — E669 Obesity, unspecified: Secondary | ICD-10-CM | POA: Diagnosis not present

## 2019-08-16 DIAGNOSIS — Z1231 Encounter for screening mammogram for malignant neoplasm of breast: Secondary | ICD-10-CM

## 2019-08-16 MED ORDER — PHENTERMINE HCL 37.5 MG PO TABS: 37.5000 mg | ORAL_TABLET | Freq: Every day | ORAL | 0 refills | Status: DC

## 2019-08-16 NOTE — Progress Notes (Signed)
Virtual Visit via Video Note  I connected with Kimberly Holder   on 08/16/19 at 10:20 AM EST by a video enabled telemedicine application and verified that I am speaking with the correct person using two identifiers.  Location patient: car at work  Location provider:work or home office Persons participating in the virtual visit: patient, provider  I discussed the limitations of evaluation and management by telemedicine and the availability of in person appointments. The patient expressed understanding and agreed to proceed.   HPI: 1. Obesity with wt loss down from 180-181 to 164 lbs on phentermine 37.5 x 2 months oct-06/2019 w/o side effects out of med and gained some wt back did get down to 160 lbs Eating right and exercising    ROS: See pertinent positives and negatives per HPI.  Past Medical History:  Diagnosis Date  . Atypical squamous cells of undetermined significance (ASCUS) on Papanicolaou smear of cervix   . Cervical high risk HPV (human papillomavirus) test positive 2014  . COVID-19 06/23/2019  . Obesity (BMI 30-39.9)   . PCOS (polycystic ovarian syndrome)   . Preeclampsia    during pregnancy    Past Surgical History:  Procedure Laterality Date  . CESAREAN SECTION    . COLPOSCOPY W/ BIOPSY / CURETTAGE  08/01/2014   BX Neg  . TONSILLECTOMY AND ADENOIDECTOMY  1996    Family History  Problem Relation Age of Onset  . Arthritis Mother   . Diabetes Mother        also had a pituitary adenoma  . Endocrine tumor Mother        cancerous pituitary tumor per pt   . Cancer Mother 20       brain  . Hypertension Father   . Other Father        benign "neck" tumor  . Arthritis Brother   . Diabetes Maternal Grandmother   . Heart disease Maternal Grandmother     SOCIAL HX: married with kids  Current Outpatient Medications:  .  drospirenone-ethinyl estradiol (YAZ) 3-0.02 MG tablet, Take 1 tablet by mouth daily., Disp: 1 Package, Rfl: 11 .  phentermine (ADIPEX-P) 37.5 MG  tablet, Take 1 tablet (37.5 mg total) by mouth daily before breakfast., Disp: 60 tablet, Rfl: 0  EXAM:  VITALS per patient if applicable:  GENERAL: alert, oriented, appears well and in no acute distress  HEENT: atraumatic, conjunttiva clear, no obvious abnormalities on inspection of external nose and ears  NECK: normal movements of the head and neck  LUNGS: on inspection no signs of respiratory distress, breathing rate appears normal, no obvious gross SOB, gasping or wheezing  CV: no obvious cyanosis  MS: moves all visible extremities without noticeable abnormality  PSYCH/NEURO: pleasant and cooperative, no obvious depression or anxiety, speech and thought processing grossly intact  ASSESSMENT AND PLAN:  Discussed the following assessment and plan:  Obesity (BMI 30-39.9) - Plan: phentermine (ADIPEX-P) 37.5 MG tablet Flu shot not had today  Td had 12/31/15 Consider check hep A/B in future   Pap neg HPV but ASCUS + 06/2018 ob/gyn 07/26/19 negative neg HPV  Mammogram had BL 35/6 screening due age 75 y.o, ordered  rec healthy diet and exercise  smartie pants mvt, vitamin C, vitamin D 2000 Iu qd, iron, biotin   -we discussed possible serious and likely etiologies, options for evaluation and workup, limitations of telemedicine visit vs in person visit, treatment, treatment risks and precautions. Pt prefers to treat via telemedicine empirically rather then risking or undertaking an  in person visit at this moment. Patient agrees to seek prompt in person care if worsening, new symptoms arise, or if is not improving with treatment.   I discussed the assessment and treatment plan with the patient. The patient was provided an opportunity to ask questions and all were answered. The patient agreed with the plan and demonstrated an understanding of the instructions.   The patient was advised to call back or seek an in-person evaluation if the symptoms worsen or if the condition fails to improve  as anticipated.  Time spent 20 minutes Delorise Jackson, MD

## 2019-09-24 ENCOUNTER — Encounter: Payer: Self-pay | Admitting: Internal Medicine

## 2019-10-24 ENCOUNTER — Encounter: Payer: Self-pay | Admitting: Internal Medicine

## 2019-12-31 ENCOUNTER — Encounter: Payer: Self-pay | Admitting: Internal Medicine

## 2020-01-06 ENCOUNTER — Other Ambulatory Visit: Payer: Self-pay | Admitting: Internal Medicine

## 2020-01-06 DIAGNOSIS — E669 Obesity, unspecified: Secondary | ICD-10-CM

## 2020-01-06 MED ORDER — PHENTERMINE HCL 37.5 MG PO TABS: 37.5000 mg | ORAL_TABLET | Freq: Every day | ORAL | 0 refills | Status: DC

## 2020-04-08 ENCOUNTER — Ambulatory Visit: Payer: 59 | Admitting: Dermatology

## 2020-04-10 ENCOUNTER — Encounter: Payer: 59 | Admitting: Internal Medicine

## 2020-04-20 ENCOUNTER — Other Ambulatory Visit: Payer: Self-pay

## 2020-04-23 ENCOUNTER — Encounter: Payer: 59 | Admitting: Internal Medicine

## 2020-04-23 ENCOUNTER — Other Ambulatory Visit: Payer: Self-pay

## 2020-04-24 ENCOUNTER — Encounter: Payer: Self-pay | Admitting: Internal Medicine

## 2020-04-24 ENCOUNTER — Ambulatory Visit (INDEPENDENT_AMBULATORY_CARE_PROVIDER_SITE_OTHER): Payer: 59 | Admitting: Internal Medicine

## 2020-04-24 VITALS — BP 112/74 | HR 81 | Temp 98.1°F | Ht 62.21 in | Wt 154.4 lb

## 2020-04-24 DIAGNOSIS — Z13220 Encounter for screening for lipoid disorders: Secondary | ICD-10-CM

## 2020-04-24 DIAGNOSIS — Z8616 Personal history of COVID-19: Secondary | ICD-10-CM

## 2020-04-24 DIAGNOSIS — Z0184 Encounter for antibody response examination: Secondary | ICD-10-CM

## 2020-04-24 DIAGNOSIS — Z0000 Encounter for general adult medical examination without abnormal findings: Secondary | ICD-10-CM | POA: Diagnosis not present

## 2020-04-24 DIAGNOSIS — Z872 Personal history of diseases of the skin and subcutaneous tissue: Secondary | ICD-10-CM | POA: Diagnosis not present

## 2020-04-24 DIAGNOSIS — Z1159 Encounter for screening for other viral diseases: Secondary | ICD-10-CM

## 2020-04-24 DIAGNOSIS — E663 Overweight: Secondary | ICD-10-CM | POA: Insufficient documentation

## 2020-04-24 DIAGNOSIS — Z1329 Encounter for screening for other suspected endocrine disorder: Secondary | ICD-10-CM

## 2020-04-24 DIAGNOSIS — Z1389 Encounter for screening for other disorder: Secondary | ICD-10-CM

## 2020-04-24 DIAGNOSIS — Z13818 Encounter for screening for other digestive system disorders: Secondary | ICD-10-CM

## 2020-04-24 HISTORY — DX: Overweight: E66.3

## 2020-04-24 MED ORDER — PHENTERMINE HCL 37.5 MG PO TABS: 37.5000 mg | ORAL_TABLET | Freq: Every day | ORAL | 0 refills | Status: DC

## 2020-04-24 NOTE — Progress Notes (Signed)
Chief Complaint  Patient presents with   Annual Exam   Annual doing well wt down but has been down to 142 before  1. Overweight resume adipex goal wt 131/132 lbs    Review of Systems  Constitutional: Positive for weight loss.  HENT: Negative for hearing loss.   Eyes: Negative for blurred vision.  Respiratory: Negative for shortness of breath.   Cardiovascular: Negative for chest pain.  Gastrointestinal: Negative for abdominal pain.  Musculoskeletal: Negative for falls and joint pain.  Skin: Negative for rash.  Neurological: Negative for headaches.  Psychiatric/Behavioral: Negative for depression.   Past Medical History:  Diagnosis Date   Atypical squamous cells of undetermined significance (ASCUS) on Papanicolaou smear of cervix    Cervical high risk HPV (human papillomavirus) test positive 2014   COVID-19 06/23/2019   Obesity (BMI 30-39.9)    PCOS (polycystic ovarian syndrome)    Preeclampsia    during pregnancy   Past Surgical History:  Procedure Laterality Date   CESAREAN SECTION     COLPOSCOPY W/ BIOPSY / CURETTAGE  08/01/2014   BX Neg   TONSILLECTOMY AND ADENOIDECTOMY  1996   Family History  Problem Relation Age of Onset   Arthritis Mother    Diabetes Mother        also had a pituitary adenoma   Endocrine tumor Mother        cancerous pituitary tumor per pt    Cancer Mother 40       brain   Hypertension Father    Other Father        benign "neck" tumor   Arthritis Brother    Diabetes Maternal Grandmother    Heart disease Maternal Grandmother    Social History   Socioeconomic History   Marital status: Married    Spouse name: Nestor Ramp "Thayer Ohm"   Number of children: 2   Years of education: Not on file   Highest education level: Not on file  Occupational History   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   Enjoys spending time with family.   Never smoker    Social Determinants of Corporate investment banker Strain:    Difficulty of Paying Living Expenses: Not on file  Food Insecurity:    Worried About Programme researcher, broadcasting/film/video in the Last Year: Not on file   [employer] of Food in the Last Year: Not on file  Transportation Needs:    Lack of Transportation (Medical): Not on file   Lack of Transportation (Non-Medical): Not on file  Physical Activity:    Days of Exercise per Week: Not on file   Minutes of Exercise per Session: Not on file  Stress:    Feeling of Stress : Not on file  Social Connections:    Frequency of Communication with Friends and Family: Not on file   Frequency of Social Gatherings with Friends and Family: Not on file   Attends  Religious Services: Not on file   Active Member of Clubs or Organizations: Not on file   Attends Club or Organization Meetings: Not on file   Marital Status: Not on file  Intimate Partner Violence:    Fear of Current or Ex-Partner: Not on file   Emotionally Abused: Not on file   Physically Abused: Not on file   Sexually Abused: Not on file   Current Meds  Medication Sig   drospirenone-ethinyl estradiol (YAZ) 3-0.02 MG tablet Take 1 tablet by mouth daily.   phentermine (ADIPEX-P) 37.5 MG tablet Take 1 tablet (37.5 mg total) by mouth daily before breakfast.   phentermine (ADIPEX-P) 37.5 MG tablet Take 1 tablet (37.5 mg total) by mouth daily before breakfast.   [DISCONTINUED] phentermine (ADIPEX-P) 37.5 MG tablet Take 1 tablet (37.5 mg total) by mouth  daily before breakfast.   [DISCONTINUED] phentermine (ADIPEX-P) 37.5 MG tablet Take 1 tablet (37.5 mg total) by mouth daily before breakfast.   No Known Allergies No results found for this or any previous visit (from the past 2160 hour(s)). Objective  Body mass index is 28.05 kg/m. Wt Readings from Last 3 Encounters:  04/24/20 154 lb 6.4 oz (70 kg)  08/16/19 164 lb (74.4 kg)  07/26/19 160 lb (72.6 kg)   Temp Readings from Last 3 Encounters:  04/24/20 98.1 F (36.7 C) (Oral)  04/09/19 97.9 F (36.6 C) (Oral)  05/30/18 98.2 F (36.8 C) (Oral)   BP Readings from Last 3 Encounters:  04/24/20 112/74  07/26/19 100/80  04/09/19 118/76   Pulse Readings from Last 3 Encounters:  04/24/20 81  07/26/19 85  04/09/19 79    Physical Exam Vitals and nursing note reviewed.  Constitutional:      Appearance: Normal appearance. She is well-developed, well-groomed and overweight.  HENT:     Head: Normocephalic and atraumatic.  Eyes:     Conjunctiva/sclera: Conjunctivae normal.     Pupils: Pupils are equal, round, and reactive to light.  Cardiovascular:     Rate and Rhythm: Normal rate and regular rhythm.     Heart sounds: Normal heart sounds. No murmur heard.   Pulmonary:     Effort: Pulmonary effort is normal.     Breath sounds: Normal breath sounds.  Abdominal:     General: Abdomen is flat. Bowel sounds are normal.     Tenderness: There is no abdominal tenderness.  Skin:    General: Skin is warm and dry.  Neurological:     General: No focal deficit present.     Mental Status: She is alert and oriented to person, place, and time. Mental status is at baseline.     Gait: Gait normal.  Psychiatric:        Attention and Perception: Attention and perception normal.        Mood and Affect: Mood and affect normal.        Speech: Speech normal.        Behavior: Behavior normal. Behavior is cooperative.        Thought Content: Thought content normal.        Cognition and Memory:  Cognition and memory normal.        Judgment: Judgment normal.     Assessment  Plan  Annual physical exam - Plan: fasting labs  Flu shot declines  Td had 12/31/15 Consider check hep A/B/C in future ordered Disc pfizer shot covid   Pap neg HPV but ASCUS + 06/2018 ob/gyn 07/26/19 negative neg HPV  07/2020  due for f/u goes yearly   Mammogram had BL 35/6 screening due age 20 y.o, ordered sch 06/01/20   Colonoscopy age 14 y.o   rec healthy diet and exercise   smartie pants mvt, vitamin C, vitamin D 2000 Iu qd, iron, biotin  Derm Dr. Kirtland Bouchard   Overweight (BMI 25.0-29.9) - Plan: phentermine (ADIPEX-P) 37.5 MG tablet, phentermine (ADIPEX-P) 37.5 MG tablet  History of melasma F/u Dr. Kirtland Bouchard derm improved melasma  Provider: Dr. French Ana McLean-Scocuzza-Internal Medicine

## 2020-04-24 NOTE — Patient Instructions (Addendum)
Keep up the good work !   Consider HPV vaccine x 3 doses   Dysmenorrhea  Dysmenorrhea refers to cramps caused by the muscles of the uterus tightening (contracting) during a menstrual period. Dysmenorrhea may be mild, or it may be severe enough to interfere with everyday activities for a few days each month. Primary dysmenorrhea is menstrual cramps that last a couple of days when you start having menstrual periods or soon after. This often begins after a teenager starts having her period. As a woman gets older or has a baby, the cramps will usually lessen or disappear. Secondary dysmenorrhea begins later in life and is caused by a disorder in the reproductive system. It lasts longer, and it may cause more pain than primary dysmenorrhea. The pain may start before the period and last a few days after the period. What are the causes? Dysmenorrhea is usually caused by an underlying problem, such as:  The tissue that lines the uterus (endometrium) growing outside of the uterus in other areas of the body (endometriosis).  Endometrial tissue growing into the muscular walls of the uterus (adenomyosis).  Blood vessels in the pelvis becoming filled with blood just before the menstrual period (pelvic congestive syndrome).  Overgrowth of cells (polyps) in the endometrium or the lower part of the uterus (cervix).  The uterus dropping down into the vagina (prolapse) due to stretched or weak muscles.  Bladder problems, such as infection or inflammation.  Intestinal problems, such as a tumor or irritable bowel syndrome.  Cancer of the reproductive organs or bladder.  A severely tipped uterus.  A cervix that is closed or has a very small opening.  Noncancerous (benign) tumors of the uterus (fibroids).  Pelvic inflammatory disease (PID).  Pelvic scarring (adhesions) from a previous surgery.  An ovarian cyst.  An IUD (intrauterine device). What increases the risk? You are more likely to develop  this condition if:  You are younger than age 24.  You started puberty early.  You have irregular or heavy bleeding.  You have never given birth.  You have a family history of dysmenorrhea.  You smoke. What are the signs or symptoms? Symptoms of this condition include:  Cramping, throbbing pain, or a feeling of fullness in the lower abdomen.  Lower back pain.  Periods lasting for longer than 7 days.  Headaches.  Bloating.  Fatigue.  Nausea or vomiting.  Diarrhea.  Sweating or dizziness.  Loose stools. How is this diagnosed? This condition may be diagnosed based on:  Your symptoms.  Your medical history.  A physical exam.  Blood tests.  A Pap test. This is a test in which cells from the cervix are tested for signs of cancer or infection.  A pregnancy test.  Imaging tests, such as: ? Ultrasound. ? A procedure to remove and examine a sample of endometrial tissue (dilation and curettage, D&C). ? A procedure to visually examine the inside of:  The uterus (hysteroscopy).  The abdomen or pelvis (laparoscopy).  The bladder (cystoscopy).  The intestine (colonoscopy).  The stomach (gastroscopy). ? X-rays. ? CT scan. ? MRI. How is this treated? Treatment depends on the cause of the dysmenorrhea. Treatment may include:  Pain medicine prescribed by your health care provider.  Birth control pills that contain the hormone progesterone.  An IUD that contains the hormone progesterone.  Medicines to control bleeding.  Hormone replacement therapy.  NSAIDs. These may help to stop the production of hormones that cause cramps.  Antidepressant medicines.  Surgery  to remove adhesions, endometriosis, ovarian cysts, fibroids, or the entire uterus (hysterectomy).  Injections of progesterone to stop the menstrual period.  A procedure to destroy the endometrium (endometrial ablation).  A procedure to cut the nerves in the bottom of the spine (sacrum) that  go to the reproductive organs (presacral neurectomy).  A procedure to apply an electric current to nerves in the sacrum (sacral nerve stimulation).  Exercise and physical therapy.  Meditation and yoga therapy.  Acupuncture. Work with your health care provider to determine what treatment or combination of treatments is best for you. Follow these instructions at home: Relieving pain and cramping  Apply heat to your lower back or abdomen when you experience pain or cramps. Use the heat source that your health care provider recommends, such as a moist heat pack or a heating pad. ? Place a towel between your skin and the heat source. ? Leave the heat on for 20-30 minutes. ? Remove the heat if your skin turns bright red. This is especially important if you are unable to feel pain, heat, or cold. You may have a greater risk of getting burned. ? Do not sleep with a heating pad on.  Do aerobic exercises, such as walking, swimming, or biking. This can help to relieve cramps.  Massage your lower back or abdomen to help relieve pain. General instructions  Take over-the-counter and prescription medicines only as told by your health care provider.  Do not drive or use heavy machinery while taking prescription pain medicine.  Avoid alcohol and caffeine during and right before your menstrual period. These can make cramps worse.  Do not use any products that contain nicotine or tobacco, such as cigarettes and e-cigarettes. If you need help quitting, ask your health care provider.  Keep all follow-up visits as told by your health care provider. This is important. Contact a health care provider if:  You have pain that gets worse or does not get better with medicine.  You have pain with sex.  You develop nausea or vomiting with your period that is not controlled with medicine. Get help right away if:  You faint. Summary  Dysmenorrhea refers to cramps caused by the muscles of the uterus  tightening (contracting) during a menstrual period.  Dysmenorrhea may be mild, or it may be severe enough to interfere with everyday activities for a few days each month.  Treatment depends on the cause of the dysmenorrhea.  Work with your health care provider to determine what treatment or combination of treatments is best for you. This information is not intended to replace advice given to you by your health care provider. Make sure you discuss any questions you have with your health care provider. Document Revised: 06/02/2017 Document Reviewed: 07/23/2016 Elsevier Patient Education  2020 [employer].

## 2020-05-01 ENCOUNTER — Other Ambulatory Visit: Payer: Self-pay

## 2020-05-01 ENCOUNTER — Other Ambulatory Visit (INDEPENDENT_AMBULATORY_CARE_PROVIDER_SITE_OTHER): Payer: 59

## 2020-05-01 DIAGNOSIS — Z13220 Encounter for screening for lipoid disorders: Secondary | ICD-10-CM | POA: Diagnosis not present

## 2020-05-01 DIAGNOSIS — Z1389 Encounter for screening for other disorder: Secondary | ICD-10-CM

## 2020-05-01 DIAGNOSIS — Z1159 Encounter for screening for other viral diseases: Secondary | ICD-10-CM

## 2020-05-01 DIAGNOSIS — Z13818 Encounter for screening for other digestive system disorders: Secondary | ICD-10-CM

## 2020-05-01 DIAGNOSIS — Z1329 Encounter for screening for other suspected endocrine disorder: Secondary | ICD-10-CM | POA: Diagnosis not present

## 2020-05-01 DIAGNOSIS — Z0000 Encounter for general adult medical examination without abnormal findings: Secondary | ICD-10-CM

## 2020-05-01 DIAGNOSIS — Z8616 Personal history of COVID-19: Secondary | ICD-10-CM

## 2020-05-01 DIAGNOSIS — Z0184 Encounter for antibody response examination: Secondary | ICD-10-CM

## 2020-05-01 LAB — CBC WITH DIFFERENTIAL/PLATELET
Basophils Absolute: 0 10*3/uL (ref 0.0–0.1)
Basophils Relative: 0.7 % (ref 0.0–3.0)
Eosinophils Absolute: 0 10*3/uL (ref 0.0–0.7)
Eosinophils Relative: 0.4 % (ref 0.0–5.0)
HCT: 41.1 % (ref 36.0–46.0)
Hemoglobin: 14.5 g/dL (ref 12.0–15.0)
Lymphocytes Relative: 25.1 % (ref 12.0–46.0)
Lymphs Abs: 1.6 10*3/uL (ref 0.7–4.0)
MCHC: 35.3 g/dL (ref 30.0–36.0)
MCV: 86.1 fl (ref 78.0–100.0)
Monocytes Absolute: 0.3 10*3/uL (ref 0.1–1.0)
Monocytes Relative: 4.8 % (ref 3.0–12.0)
Neutro Abs: 4.4 10*3/uL (ref 1.4–7.7)
Neutrophils Relative %: 69 % (ref 43.0–77.0)
Platelets: 241 10*3/uL (ref 150.0–400.0)
RBC: 4.77 Mil/uL (ref 3.87–5.11)
RDW: 12.1 % (ref 11.5–15.5)
WBC: 6.4 10*3/uL (ref 4.0–10.5)

## 2020-05-01 LAB — COMPREHENSIVE METABOLIC PANEL
ALT: 13 U/L (ref 0–35)
AST: 16 U/L (ref 0–37)
Albumin: 4.3 g/dL (ref 3.5–5.2)
Alkaline Phosphatase: 48 U/L (ref 39–117)
BUN: 12 mg/dL (ref 6–23)
CO2: 27 mEq/L (ref 19–32)
Calcium: 9.2 mg/dL (ref 8.4–10.5)
Chloride: 103 mEq/L (ref 96–112)
Creatinine, Ser: 0.95 mg/dL (ref 0.40–1.20)
GFR: 75.25 mL/min (ref >60.00)
Glucose, Bld: 70 mg/dL (ref 70–99)
Potassium: 4.2 mEq/L (ref 3.5–5.1)
Sodium: 139 mEq/L (ref 135–145)
Total Bilirubin: 0.5 mg/dL (ref 0.2–1.2)
Total Protein: 7.6 g/dL (ref 6.0–8.3)

## 2020-05-01 LAB — TSH: TSH: 0.79 u[IU]/mL (ref 0.35–4.50)

## 2020-05-01 LAB — LIPID PANEL
Cholesterol: 215 mg/dL — ABNORMAL HIGH (ref 0–200)
HDL: 80.5 mg/dL (ref >39.00)
LDL Cholesterol: 124 mg/dL — ABNORMAL HIGH (ref 0–99)
NonHDL: 134.62
Total CHOL/HDL Ratio: 3
Triglycerides: 55 mg/dL (ref 0.0–149.0)
VLDL: 11 mg/dL (ref 0.0–40.0)

## 2020-05-02 LAB — URINALYSIS, ROUTINE W REFLEX MICROSCOPIC
Bilirubin Urine: NEGATIVE
Glucose, UA: NEGATIVE
Hgb urine dipstick: NEGATIVE
Leukocytes,Ua: NEGATIVE
Nitrite: NEGATIVE
Protein, ur: NEGATIVE
Specific Gravity, Urine: 1.025 (ref 1.001–1.03)
pH: 5.5 (ref 5.0–8.0)

## 2020-05-04 LAB — SARS-COV-2 SEMI-QUANTITATIVE TOTAL ANTIBODY, SPIKE: SARS COV2 AB, Total Spike Semi QN: 131.5 U/mL — ABNORMAL HIGH (ref <0.8)

## 2020-05-04 LAB — HEPATITIS C ANTIBODY
Hepatitis C Ab: NONREACTIVE
SIGNAL TO CUT-OFF: 0.02 (ref <1.00)

## 2020-05-04 LAB — HEPATITIS A ANTIBODY, TOTAL: Hepatitis A AB,Total: NONREACTIVE

## 2020-05-04 LAB — HEPATITIS B SURFACE ANTIBODY, QUANTITATIVE: Hep B S AB Quant (Post): <5 m[IU]/mL — ABNORMAL LOW (ref >=10)

## 2020-05-27 ENCOUNTER — Telehealth: Payer: Self-pay

## 2020-05-27 NOTE — Telephone Encounter (Signed)
Pt calling; has sl pain and odor.  [phone]  Pt states she has just finished her period not long ago; no has an odor that is not her normal; doesn't believe a tampon was left in - has tried to feel for it and doesn't feel it.  We have no appt availability; adv pt to either monitor it and if worsens to be seen and not use anything the night before appt or go to Urgent Care.  Pt adv also if worsens she can try monistat.

## 2020-06-01 ENCOUNTER — Ambulatory Visit
Admission: RE | Admit: 2020-06-01 | Discharge: 2020-06-01 | Disposition: A | Discharge status: Home / Self Care | Payer: 59 | Source: Ambulatory Visit | Attending: Internal Medicine | Admitting: Internal Medicine

## 2020-06-01 ENCOUNTER — Other Ambulatory Visit: Payer: Self-pay

## 2020-06-01 DIAGNOSIS — Z1231 Encounter for screening mammogram for malignant neoplasm of breast: Secondary | ICD-10-CM | POA: Diagnosis not present

## 2020-07-26 NOTE — Progress Notes (Signed)
PCP:  McLean-Scocuzza, Pasty Spillers, MD   Chief Complaint  Patient presents with  . Gynecologic Exam    No concerns     HPI:      Ms. Kimberly Holder is a 40 y.o. [id number] whose LMP was Patient's last menstrual period was 07/23/2020 (exact date)., presents today for her annual examination.  Her menses are regular every 28-30 days, lasting 4-5 days.  Dysmenorrhea none. She does not have intermenstrual bleeding. Gets headache before menses.   Sex activity: single partner, contraception - OCP (estrogen/progesterone).  Last Pap: 07/26/19  Results were: no abnormalities /neg HPV DNA; hx of ASCUS paps and pos HPV DNA since 2014; s/p mult colpos; repeat pap due today Hx of STDs: HPV  Last mammogram: 06/01/20  Results were: normal--routine follow-up in 12 months There is no FH of breast cancer. There is no FH of ovarian cancer. The patient does do self-breast exams.  Tobacco use: none Alcohol use: none No drug use.  Exercise: very active  She does get adequate calcium and Vitamin D in her diet.  Labs with PCP. TSH followed due to FH hypothyroidism in her mom.    Past Medical History:  Diagnosis Date  . Atypical squamous cells of undetermined significance (ASCUS) on Papanicolaou smear of cervix   . Cervical high risk HPV (human papillomavirus) test positive 2014  . COVID-19 06/23/2019  . Obesity (BMI 30-39.9)   . PCOS (polycystic ovarian syndrome)   . Preeclampsia    during pregnancy    Past Surgical History:  Procedure Laterality Date  . CESAREAN SECTION    . COLPOSCOPY W/ BIOPSY / CURETTAGE  08/01/2014   BX Neg  . TONSILLECTOMY AND ADENOIDECTOMY  1996    Family History  Problem Relation Age of Onset  . Arthritis Mother   . Diabetes Mother        also had a pituitary adenoma  . Endocrine tumor Mother        cancerous pituitary tumor per pt   . Cancer Mother 38       brain  . Hypertension Father   . Other Father        benign "neck" tumor  . Arthritis Brother   .  Diabetes Maternal Grandmother   . Heart disease Maternal Grandmother   . Breast cancer Neg Hx     Social History   Socioeconomic History  . Marital status: Married    Spouse name: Nestor Ramp "Thayer Ohm"  . Number of children: 2  . Years of education: Not on file  . Highest education level: Not on file  Occupational History  . 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 Smoking status: Never Smoker  . Smokeless tobacco: Never Used  Vaping Use  . Vaping Use: Never used  Substance and Sexual Activity  . Alcohol use: No    Alcohol/week: 0.0 standard drinks  . Drug use: No  . Sexual activity: Yes    Birth control/protection: Pill  Other Topics Concern  . Not on file  Social History Narrative   Married husband Thayer Ohm   2 children (1 with husband son, daughter with former partner and step daughter)   Works for [employer], [employer] (former), [employer].   Enjoys spending time with family.   Never smoker    Social Determinants of Corporate investment banker Strain: Not on file  Food Insecurity: Not on file  Transportation Needs: Not on file  Physical Activity: Not on file  Stress: Not on  file  Social Connections: Not on file  Intimate Partner Violence: Not on file     Current Outpatient Medications:  .  phentermine (ADIPEX-P) 37.5 MG tablet, Take 1 tablet (37.5 mg total) by mouth daily before breakfast., Disp: 60 tablet, Rfl: 0 .  phentermine (ADIPEX-P) 37.5 MG tablet, Take 1 tablet (37.5 mg total) by mouth daily before breakfast., Disp: 60 tablet, Rfl: 0 .  drospirenone-ethinyl estradiol (YAZ) 3-0.02 MG tablet, Take 1 tablet by mouth daily., Disp: 84 tablet, Rfl: 3     ROS:  Review of Systems  Constitutional: Negative for fatigue, fever and unexpected weight change.  Respiratory: Negative for cough, shortness of breath and wheezing.   Cardiovascular: Negative for chest pain, palpitations and leg swelling.  Gastrointestinal: Negative for blood in stool,  constipation, diarrhea, nausea and vomiting.  Endocrine: Negative for cold intolerance, heat intolerance and polyuria.  Genitourinary: Negative for dyspareunia, dysuria, flank pain, frequency, genital sores, hematuria, menstrual problem, pelvic pain, urgency, vaginal bleeding, vaginal discharge and vaginal pain.  Musculoskeletal: Negative for back pain, joint swelling and myalgias.  Skin: Negative for rash.  Neurological: Negative for dizziness, syncope, light-headedness, numbness and headaches.  Hematological: Negative for adenopathy.  Psychiatric/Behavioral: Negative for agitation, confusion, sleep disturbance and suicidal ideas. The patient is not nervous/anxious.   BREAST: No symptoms   Objective: BP 110/70   Ht 5\' 1"  (1.549 m)   Wt 151 lb (68.5 kg)   LMP 07/23/2020 (Exact Date)   BMI 28.53 kg/m    Physical Exam Constitutional:      Appearance: She is well-developed.  Genitourinary:     Vulva normal.     Right Labia: No rash, tenderness or lesions.    Left Labia: No tenderness, lesions or rash.    No vaginal discharge, erythema or tenderness.      Right Adnexa: not tender and no mass present.    Left Adnexa: not tender and no mass present.    No cervical friability or polyp.     Uterus is not enlarged or tender.  Breasts:     Right: No mass, nipple discharge, skin change or tenderness.     Left: No mass, nipple discharge, skin change or tenderness.    Neck:     Thyroid: No thyromegaly.  Cardiovascular:     Rate and Rhythm: Normal rate and regular rhythm.     Heart sounds: Normal heart sounds. No murmur heard.   Pulmonary:     Effort: Pulmonary effort is normal.     Breath sounds: Normal breath sounds.  Abdominal:     Palpations: Abdomen is soft.     Tenderness: There is no abdominal tenderness. There is no guarding or rebound.  Musculoskeletal:        General: Normal range of motion.     Cervical back: Normal range of motion.  Lymphadenopathy:     Cervical:  No cervical adenopathy.  Neurological:     General: No focal deficit present.     Mental Status: She is alert and oriented to person, place, and time.     Cranial Nerves: No cranial nerve deficit.  Skin:    General: Skin is warm and dry.  Psychiatric:        Mood and Affect: Mood normal.        Behavior: Behavior normal.        Thought Content: Thought content normal.        Judgment: Judgment normal.  Vitals reviewed.     Assessment/Plan: Encounter  for annual routine gynecological examination  Cervical cancer screening - Plan: Cytology - PAP  Screening for HPV (human papillomavirus) - Plan: Cytology - PAP  History of abnormal cervical Pap smear - Plan: Cytology - PAP; repeat pap today. Will f/u if abn  Encounter for surveillance of contraceptive pills - Plan: drospirenone-ethinyl estradiol (YAZ) 3-0.02 MG tablet; OCP RF  Encounter for screening mammogram for malignant neoplasm of breast - Plan: MM 3D SCREEN BREAST BILATERAL; pt to sched mammo 11/22  Meds ordered this encounter  Medications  . drospirenone-ethinyl estradiol (YAZ) 3-0.02 MG tablet    Sig: Take 1 tablet by mouth daily.    Dispense:  84 tablet    Refill:  3    Order Specific Question:   Supervising Provider    Answer:   Nadara Mustard [[id number]]             GYN counsel breast self exam, mammography screening, adequate intake of calcium and vitamin D, diet and exercise     F/U  Return in about 1 year (around 07/27/2021).  Nakeesha Bowler B. Jenise Iannelli, PA-C 07/27/2020 11:53 AM

## 2020-07-27 ENCOUNTER — Ambulatory Visit (INDEPENDENT_AMBULATORY_CARE_PROVIDER_SITE_OTHER): Payer: 59 | Admitting: Obstetrics and Gynecology

## 2020-07-27 ENCOUNTER — Encounter: Payer: Self-pay | Admitting: Obstetrics and Gynecology

## 2020-07-27 ENCOUNTER — Other Ambulatory Visit: Payer: Self-pay

## 2020-07-27 ENCOUNTER — Other Ambulatory Visit (HOSPITAL_COMMUNITY)
Admission: RE | Admit: 2020-07-27 | Discharge: 2020-07-27 | Disposition: A | Discharge status: Home / Self Care | Payer: 59 | Source: Ambulatory Visit | Attending: Obstetrics and Gynecology | Admitting: Obstetrics and Gynecology

## 2020-07-27 VITALS — BP 110/70 | Ht 61.0 in | Wt 151.0 lb

## 2020-07-27 DIAGNOSIS — Z1151 Encounter for screening for human papillomavirus (HPV): Secondary | ICD-10-CM | POA: Diagnosis present

## 2020-07-27 DIAGNOSIS — Z01419 Encounter for gynecological examination (general) (routine) without abnormal findings: Secondary | ICD-10-CM | POA: Diagnosis not present

## 2020-07-27 DIAGNOSIS — Z8742 Personal history of other diseases of the female genital tract: Secondary | ICD-10-CM | POA: Insufficient documentation

## 2020-07-27 DIAGNOSIS — Z3041 Encounter for surveillance of contraceptive pills: Secondary | ICD-10-CM

## 2020-07-27 DIAGNOSIS — Z124 Encounter for screening for malignant neoplasm of cervix: Secondary | ICD-10-CM | POA: Insufficient documentation

## 2020-07-27 DIAGNOSIS — Z1231 Encounter for screening mammogram for malignant neoplasm of breast: Secondary | ICD-10-CM

## 2020-07-27 MED ORDER — DROSPIRENONE-ETHINYL ESTRADIOL 3-0.02 MG PO TABS: 1.0000 | ORAL_TABLET | Freq: Every day | ORAL | 3 refills | Status: DC

## 2020-07-27 NOTE — Patient Instructions (Addendum)
I value your feedback and you entrusting us with your care. If you get a [facility] patient survey, I would appreciate you taking the time to let us know about your experience today. Thank you!  [facility]: [phone]      

## 2020-07-30 LAB — CYTOLOGY - PAP
Comment: NEGATIVE
High risk HPV: NEGATIVE

## 2020-08-12 ENCOUNTER — Telehealth: Payer: Self-pay

## 2020-08-12 NOTE — Telephone Encounter (Signed)
Pt calling; had annual in Jan; rx was supposed to have been sent to [facility] but they say they don't have rx.  [phone]  Spoke to [facility], rx on file, will be ready for p/u in 90 minutes.  Pt aware.

## 2020-09-08 LAB — FETAL NONSTRESS TEST

## 2020-11-04 ENCOUNTER — Encounter: Payer: Self-pay | Admitting: Internal Medicine

## 2020-11-05 ENCOUNTER — Other Ambulatory Visit: Payer: Self-pay | Admitting: Internal Medicine

## 2020-11-05 DIAGNOSIS — E663 Overweight: Secondary | ICD-10-CM

## 2020-11-05 MED ORDER — PHENTERMINE HCL 37.5 MG PO TABS: 37.5000 mg | ORAL_TABLET | Freq: Every day | ORAL | 0 refills | Status: DC

## 2020-11-05 NOTE — Telephone Encounter (Signed)
Patient last seen and medication last sent in 04/24/20.  Please advise

## 2020-12-14 ENCOUNTER — Ambulatory Visit: Payer: 59 | Admitting: Dermatology

## 2020-12-14 ENCOUNTER — Other Ambulatory Visit: Payer: Self-pay

## 2020-12-14 DIAGNOSIS — L910 Hypertrophic scar: Secondary | ICD-10-CM

## 2020-12-14 DIAGNOSIS — L811 Chloasma: Secondary | ICD-10-CM | POA: Diagnosis not present

## 2020-12-14 NOTE — Patient Instructions (Signed)

## 2020-12-14 NOTE — Progress Notes (Signed)
   Follow-Up Visit   Subjective  Kimberly Holder is a 40 y.o. female who presents for the following: Melasma  (Patient has used the Skin Medicinals Hydroquinone mix about a year ago and it helped lighten areas, she is also using a Heliocare supplement daily. She did lay in the tanning a few months ago and areas became dark again). Patient states that she would also like her keloid checked today too on her R post shoulder.   The following portions of the chart were reviewed this encounter and updated as appropriate:   Tobacco  Allergies  Meds  Problems  Med Hx  Surg Hx  Fam Hx      Review of Systems:  No other skin or systemic complaints except as noted in HPI or Assessment and Plan.  Objective  Well appearing patient in no apparent distress; mood and affect are within normal limits.  A focused examination was performed including the face and back. Relevant physical exam findings are noted in the Assessment and Plan.  Face Reticulated hyperpigmented patches.          R post shoulder Firm pink/brown dermal papule(s)/plaque(s).    Assessment & Plan  Melasma -flared/worsened with tanning booth use Face Restart Skin Medicinals mix Hydroquinone 12%/Kojic acid 6%/Vitamin C 1%/ Niacinamide 2% cream BID no more than 3 months.   Continue Heliocare supplement daily.   Melasma is a condition of persistent pigmented patches generally on the face, worse in summer due to higher UV exposure.  Oral estrogen containing BCPs or supplements can exacerbate condition.  Recommend daily broad spectrum tinted sunscreen SPF 30+ to face, preferably with Zinc or Titanium Dioxide. Discussed Rx topical bleaching creams (i.e. hydroquinone), OTC HelioCare supplement, chemical peels (would need multiple for best result).   Keloid -symptomatic R post shoulder Discussed ILK injections if bothersome. Patient to check with her insurance for cost.   Return in about 4 months (around 04/15/2021) for  melasma recheck .  Maylene Roes, CMA, am acting as scribe for Armida Sans, MD .  Documentation: I have reviewed the above documentation for accuracy and completeness, and I agree with the above.  Armida Sans, MD

## 2020-12-16 ENCOUNTER — Encounter: Payer: Self-pay | Admitting: Dermatology

## 2021-03-09 ENCOUNTER — Telehealth: Payer: Self-pay

## 2021-03-09 DIAGNOSIS — Z3201 Encounter for pregnancy test, result positive: Secondary | ICD-10-CM

## 2021-03-09 NOTE — Telephone Encounter (Signed)
Pt calling; is on birth control; has had a positive pregnancy test at home.  Was adv by front desk to leave msg to have a blood test done instead of another urine preg test.  [phone]

## 2021-03-09 NOTE — Telephone Encounter (Signed)
Order placed, pls notify pt to sched lab appt/ Thx

## 2021-03-10 ENCOUNTER — Other Ambulatory Visit: Payer: Self-pay

## 2021-03-10 ENCOUNTER — Other Ambulatory Visit: Payer: 59

## 2021-03-10 DIAGNOSIS — Z3201 Encounter for pregnancy test, result positive: Secondary | ICD-10-CM

## 2021-03-10 NOTE — Telephone Encounter (Signed)
Patient is scheduled for 03/10/21 at 2:40 for labs

## 2021-03-11 ENCOUNTER — Telehealth: Payer: Self-pay

## 2021-03-11 LAB — BETA HCG QUANT (REF LAB): hCG Quant: 44873 m[IU]/mL

## 2021-03-11 NOTE — Telephone Encounter (Signed)
Can you call and schedule her NOB

## 2021-03-11 NOTE — Telephone Encounter (Signed)
Labs show she is about 7-[redacted] wks pregnant. Not a lot of research on phentermine and first trimester exposure, but will be doing u/s, etc. What type of chemicals is she exposed to? I would then send f/u question to OB who may know answer better.

## 2021-03-11 NOTE — Telephone Encounter (Signed)
Pt aware of ABC advise, she will call and schedule her NOB.

## 2021-03-11 NOTE — Telephone Encounter (Signed)
Pt states she just got a positive blood test yesterday for pregnancy, She works in [employer] she ok doing her job? She wears a mask but not when she's outside.She was also taking Phentermine, finished now but worried something could be wrong with the baby because she was on the medication, Also is she about [redacted] weeks pregnant according to her labs?

## 2021-03-12 ENCOUNTER — Telehealth: Payer: Self-pay

## 2021-03-12 NOTE — Telephone Encounter (Signed)
Pt calling; has had a positive preg tet; has NOB in Oct; has questions related to her job and being pregnant.  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  Adv to continue doing what she's doing just be sure to wear her protective gear and d/w provider at NOB appt.

## 2021-03-15 NOTE — Telephone Encounter (Signed)
Patient is scheduled for 04/13/21 JEG

## 2021-03-22 ENCOUNTER — Telehealth: Payer: Self-pay

## 2021-03-22 NOTE — Telephone Encounter (Signed)
Pt calling; has positive preg test; has a boil or something down there; read it's common c preg; it is quite painful; ?s about work also.  [phone]  Adv to soak in warm bath of epsom salt as many times a days as she wants to or apply warm compresses; give it a week and if doesn't show improvement to call to sched appt before NOB appt.  Her job needs a letter of release; they have given her MSDS of the chemicals she works with; adv to bring them with her at [facility] appt or if needs to be seen for this boil to bring with her then b/c our providers will not give a note without seeing pt first.

## 2021-04-08 ENCOUNTER — Encounter: Payer: Self-pay | Admitting: Internal Medicine

## 2021-04-13 ENCOUNTER — Other Ambulatory Visit (HOSPITAL_COMMUNITY)
Admission: RE | Admit: 2021-04-13 | Discharge: 2021-04-13 | Disposition: A | Discharge status: Home / Self Care | Payer: 59 | Source: Ambulatory Visit | Attending: Advanced Practice Midwife | Admitting: Advanced Practice Midwife

## 2021-04-13 ENCOUNTER — Other Ambulatory Visit: Payer: Self-pay

## 2021-04-13 ENCOUNTER — Ambulatory Visit (INDEPENDENT_AMBULATORY_CARE_PROVIDER_SITE_OTHER): Payer: 59 | Admitting: Advanced Practice Midwife

## 2021-04-13 ENCOUNTER — Encounter: Payer: Self-pay | Admitting: Advanced Practice Midwife

## 2021-04-13 VITALS — BP 122/80 | HR 68 | Wt 159.0 lb

## 2021-04-13 DIAGNOSIS — O09529 Supervision of elderly multigravida, unspecified trimester: Secondary | ICD-10-CM | POA: Insufficient documentation

## 2021-04-13 DIAGNOSIS — O0991 Supervision of high risk pregnancy, unspecified, first trimester: Secondary | ICD-10-CM

## 2021-04-13 DIAGNOSIS — Z113 Encounter for screening for infections with a predominantly sexual mode of transmission: Secondary | ICD-10-CM | POA: Insufficient documentation

## 2021-04-13 DIAGNOSIS — Z369 Encounter for antenatal screening, unspecified: Secondary | ICD-10-CM | POA: Diagnosis present

## 2021-04-13 DIAGNOSIS — O0990 Supervision of high risk pregnancy, unspecified, unspecified trimester: Secondary | ICD-10-CM | POA: Insufficient documentation

## 2021-04-13 DIAGNOSIS — Z3A10 10 weeks gestation of pregnancy: Secondary | ICD-10-CM

## 2021-04-13 DIAGNOSIS — Z98891 History of uterine scar from previous surgery: Secondary | ICD-10-CM

## 2021-04-13 DIAGNOSIS — O09299 Supervision of pregnancy with other poor reproductive or obstetric history, unspecified trimester: Secondary | ICD-10-CM | POA: Insufficient documentation

## 2021-04-13 DIAGNOSIS — Z1159 Encounter for screening for other viral diseases: Secondary | ICD-10-CM

## 2021-04-13 DIAGNOSIS — O09521 Supervision of elderly multigravida, first trimester: Secondary | ICD-10-CM

## 2021-04-13 HISTORY — DX: Supervision of pregnancy with other poor reproductive or obstetric history, unspecified trimester: O09.299

## 2021-04-13 HISTORY — DX: Supervision of elderly multigravida, unspecified trimester: O09.529

## 2021-04-13 HISTORY — DX: History of uterine scar from previous surgery: Z98.891

## 2021-04-13 HISTORY — DX: Supervision of high risk pregnancy, unspecified, unspecified trimester: O09.90

## 2021-04-13 NOTE — Progress Notes (Signed)
NOB - discuss options after delivery. RM 5

## 2021-04-13 NOTE — Patient Instructions (Signed)

## 2021-04-14 ENCOUNTER — Encounter: Payer: Self-pay | Admitting: Advanced Practice Midwife

## 2021-04-14 LAB — PROTEIN / CREATININE RATIO, URINE
Creatinine, Urine: 63.5 mg/dL
Protein, Ur: 5.3 mg/dL
Protein/Creat Ratio: 83 mg/g creat (ref 0–200)

## 2021-04-14 NOTE — Progress Notes (Signed)
New Obstetric Patient H&P  Date of Service: 04/13/2021  Chief Complaint: "Desires prenatal care"   History of Present Illness: Patient is a 40 y.o. [id number] Not Hispanic or Latino female, presents with amenorrhea and positive home pregnancy test. Patient's last menstrual period was 01/28/2021 (approximate). and based on her  LMP, her EDD is Estimated Date of Delivery: 11/04/21 and her EGA is [redacted]w[redacted]d. Cycles are 5 days, regular, and occur approximately every : 28 days. Her last pap smear was 9 months ago and was  LGSIL/negative HR HPV .    She had a urine pregnancy test which was positive 4 or 5 week(s)  ago. Her last menstrual period was normal and lasted for 5 day(s). Since her LMP she claims she has experienced breast tenderness, fatigue. She denies vaginal bleeding. Her past medical history is contributory for PCOS. Her prior pregnancies are notable for  PIH, Preeclampsia, 2 cesarean sections.  Since her LMP, she admits to the use of tobacco products  no She claims she has gained 7 pounds since the start of her pregnancy.  There are cats in the home in the home  yes her husband takes care of litter box She admits close contact with children on a regular basis  yes  She has had chicken pox in the past yes She has had Tuberculosis exposures, symptoms, or previously tested positive for TB   no Current or past history of domestic violence. no  Genetic Screening/Teratology Counseling: (Includes patient, baby's father, or anyone in either family with:)   1. Patient's age >/= 48 at [facility]  yes 2. Thalassemia ([place], [place], Mediterranean, or Asian background): MCV<80  no 3. Neural tube defect (meningomyelocele, spina bifida, anencephaly)  no 4. Congenital heart defect  no  5. Down syndrome  no 6. Tay-Sachs (Jewish, [place]  no 7. Canavan's Disease  no 8. Sickle cell disease or trait (African)  no  9. Hemophilia or other blood disorders  no  10. Muscular dystrophy  no  11. Cystic  fibrosis  no  12. Huntington's Chorea  no  13. Mental retardation/autism  no 14. Other inherited genetic or chromosomal disorder  no 15. Maternal metabolic disorder (DM, PKU, etc)  no 16. Patient or FOB with a child with a birth defect not listed above no  16a. Patient or FOB with a birth defect themselves no 17. Recurrent pregnancy loss, or stillbirth  no  18. Any medications since LMP other than prenatal vitamins (include vitamins, supplements, OTC meds, drugs, alcohol)  no 19. Any other genetic/environmental exposure to discuss  no  Infection History:   1. Lives with someone with TB or TB exposed  no  2. Patient or partner has history of genital herpes  no 3. Rash or viral illness since LMP  no 4. History of STI (GC, CT, HPV, syphilis, HIV)  no 5. History of recent travel :  no  Other pertinent information:  No   Review of Systems:10 point review of systems negative unless otherwise noted in HPI  Past Medical History:  Patient Active Problem List   Diagnosis Date Noted   Supervision of high risk pregnancy, antepartum 04/13/2021     Nursing Staff Provider  Office Location  [facility] Dating    Language  English Anatomy US    Flu Vaccine   Genetic Screen  NIPS:   TDaP vaccine    Hgb A1C or  GTT Early : BMI 28 pre pregnancy Third trimester :   Covid  LAB RESULTS   Rhogam   Blood Type     Feeding Plan Breast Antibody    Contraception  Rubella    Circumcision  RPR     Pediatrician   HBsAg     Support Person Husband Thayer Ohm HIV    Prenatal Classes  Varicella     GBS  (For PCN allergy, check sensitivities)   BTL Consent     VBAC Consent  Pap  2022 LGSIL- repeat postpartum    Hgb Electro    Pelvis Tested  CF      SMA        Works for [employer] applying insecticide/wears protective gear Conceived on OCP- may have missed a dose     Advanced maternal age in multigravida 04/13/2021   Hx of preeclampsia, prior pregnancy, currently pregnant 04/13/2021   History of 2  cesarean sections 04/13/2021   Overweight (BMI 25.0-29.9) 04/24/2020   History of melasma 04/24/2020   PCOS (polycystic ovarian syndrome)    Atypical squamous cells of undetermined significance (ASCUS) on Papanicolaou smear of cervix    Hirsutism 07/10/2017    Due to PCOS    Preventative health care 12/31/2015    Past Surgical History:  Past Surgical History:  Procedure Laterality Date   CESAREAN SECTION     COLPOSCOPY W/ BIOPSY / CURETTAGE  08/01/2014   BX Neg   TONSILLECTOMY AND ADENOIDECTOMY  1996    Gynecologic History: Patient's last menstrual period was 01/28/2021 (approximate).  Obstetric History: [id number]  Family History:  Family History  Problem Relation Age of Onset   Arthritis Mother    Diabetes Mother        also had a pituitary adenoma   Endocrine tumor Mother        cancerous pituitary tumor per pt    Cancer Mother 49       brain   Hypertension Father    Other Father        benign "neck" tumor   Arthritis Brother    Diabetes Maternal Grandmother    Heart disease Maternal Grandmother    Breast cancer Neg Hx     Social History:  Social History   Socioeconomic History   Marital status: Married    Spouse name: Nestor Ramp "Thayer Ohm"   Number of children: 2   Years of education: Not on file   Highest education level: Not on file  Occupational History   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   Enjoys spending time with family.   Never smoker    Social Determinants of Manufacturing engineer Strain: Not on file  Food Insecurity: Not on file  Transportation Needs: Not on file  Physical Activity: Not on file  Stress: Not on file  Social Connections: Not on file  Intimate Partner Violence: Not on file    Allergies:  No Known Allergies  Medications: Prior to Admission medications   Medication Sig Start Date End  Date Taking? Authorizing Provider  phentermine (ADIPEX-P) 37.5 MG tablet Take 1 tablet (37.5 mg total) by mouth daily before breakfast. Patient not taking: Reported on 04/13/2021 11/05/20   McLean-Scocuzza, Pasty Spillers, MD  phentermine (ADIPEX-P) 37.5 MG tablet Take 1 tablet (37.5 mg total) by mouth daily before breakfast. Patient not taking: Reported on 04/13/2021 11/05/20   McLean-Scocuzza, Pasty Spillers, MD    Physical Exam Vitals: Blood pressure 122/80, pulse 68, weight 159 lb (72.1 kg), last menstrual period 01/28/2021.  General: NAD HEENT: normocephalic, anicteric Thyroid: no enlargement, no palpable nodules Pulmonary: No increased work of breathing, CTAB Cardiovascular: RRR, distal pulses 2+ Abdomen: NABS, soft, non-tender, non-distended.  Umbilicus without lesions.  No hepatomegaly, splenomegaly or masses palpable. No evidence of hernia  Genitourinary:  External: Normal external female genitalia.  Normal urethral meatus, normal  Bartholin's and Skene's glands.    Vagina: Normal vaginal mucosa, no evidence of prolapse.    Cervix: Grossly normal in appearance, no bleeding, no CMT  Uterus:  Non-enlarged, mobile, normal contour.    Adnexa: ovaries non-enlarged, no adnexal masses  Rectal: deferred Extremities: no edema, erythema, or tenderness Neurologic: Grossly intact Psychiatric: mood appropriate, affect full   The following were addressed during this visit:  Breastfeeding Education - Early initiation of breastfeeding    Comments: Keeps milk supply adequate, helps contract uterus and slow bleeding, and early milk is the perfect first food  and is easy to digest.   - The importance of exclusive breastfeeding    Comments: Provides antibodies, Lower risk of breast and ovarian cancers, and type-2 diabetes,Helps your body recover, Reduced chance of SIDS.   - Risks of giving your baby anything other than breast milk if you are breastfeeding    Comments: Make the baby less content with breastfeeds, may make my baby more susceptible to illness, and may reduce my milk supply.   - The importance of early skin-to-skin contact    Comments:  Keeps baby warm and secure, helps keep baby's blood sugar up and breathing steady, easier to bond and breastfeed, and helps calm baby.  - Rooming-in on a 24-hour basis    Comments: Easier to learn baby's feeding cues, easier to bond and get to know each other, and encourages milk production.   - Feeding on demand or baby-led feeding    Comments: Helps prevent breastfeeding complications, helps bring in good milk supply, prevents under or overfeeding, and helps baby feel content and satisfied   - Frequent feeding to help assure optimal milk production    Comments: Making a full supply of milk requires frequent removal of milk from breasts, infant will eat 8-12 times in 24 hours, if separated from infant use breast massage, hand expression and/ or pumping to remove milk from breasts.   - Effective positioning and attachment    Comments: Helps my baby to get enough breast milk, helps to produce an adequate milk supply, and helps prevent nipple pain and damage   - Exclusive breastfeeding for the first 6 months    Comments: Builds a healthy milk supply and keeps it up, protects baby from sickness and disease, and breastmilk has everything your baby needs for the first 6 months.   Assessment: 41 y.o. [id number] at [redacted]w[redacted]d presenting to initiate prenatal care  Plan: 1) Avoid alcoholic beverages. 2) Patient encouraged not to smoke.  3) Discontinue the use of all non-medicinal drugs and chemicals.   4) Take prenatal vitamins daily.  5) Nutrition, food safety (fish, cheese advisories, and  high nitrite foods) and exercise discussed. 6) Hospital and practice style discussed with cross coverage system.  7) Genetic Screening, such as with 1st Trimester Screening, cell free fetal DNA, AFP testing, and Ultrasound, as well as with amniocentesis and CVS as appropriate, is discussed with patient. At the conclusion of today's visit patient requested genetic testing 8) Patient is asked about travel to areas at risk for the Zika virus, and counseled to avoid travel and exposure to mosquitoes or sexual partners who may have themselves been exposed to the virus. Testing is discussed, and will be ordered as appropriate.  9) Employment with [employer]: stressed importance of adequate personal protection 10) Urine Culture, P/C ratio, Aptima today 11) Return to office in 1 week for dating scan, rob, labs including CMP and MaterniT 21   Tresea Mall, CNM [facility] 04/14/2021, 1:52 PM

## 2021-04-15 LAB — CERVICOVAGINAL ANCILLARY ONLY
Chlamydia: NEGATIVE
Comment: NEGATIVE
Comment: NEGATIVE
Comment: NORMAL
Neisseria Gonorrhea: NEGATIVE
Trichomonas: NEGATIVE

## 2021-04-15 LAB — URINE CULTURE: Organism ID, Bacteria: NO GROWTH

## 2021-04-19 ENCOUNTER — Ambulatory Visit (INDEPENDENT_AMBULATORY_CARE_PROVIDER_SITE_OTHER): Payer: 59 | Admitting: Obstetrics and Gynecology

## 2021-04-19 ENCOUNTER — Other Ambulatory Visit: Payer: Self-pay

## 2021-04-19 ENCOUNTER — Encounter: Payer: Self-pay | Admitting: Obstetrics and Gynecology

## 2021-04-19 VITALS — BP 122/74 | Wt 166.0 lb

## 2021-04-19 DIAGNOSIS — Z1379 Encounter for other screening for genetic and chromosomal anomalies: Secondary | ICD-10-CM

## 2021-04-19 DIAGNOSIS — Z98891 History of uterine scar from previous surgery: Secondary | ICD-10-CM

## 2021-04-19 DIAGNOSIS — O09521 Supervision of elderly multigravida, first trimester: Secondary | ICD-10-CM

## 2021-04-19 DIAGNOSIS — Z369 Encounter for antenatal screening, unspecified: Secondary | ICD-10-CM

## 2021-04-19 DIAGNOSIS — O0991 Supervision of high risk pregnancy, unspecified, first trimester: Secondary | ICD-10-CM

## 2021-04-19 DIAGNOSIS — Z3A11 11 weeks gestation of pregnancy: Secondary | ICD-10-CM

## 2021-04-19 DIAGNOSIS — O09299 Supervision of pregnancy with other poor reproductive or obstetric history, unspecified trimester: Secondary | ICD-10-CM

## 2021-04-19 DIAGNOSIS — O0990 Supervision of high risk pregnancy, unspecified, unspecified trimester: Secondary | ICD-10-CM

## 2021-04-19 NOTE — Progress Notes (Signed)
ULTRASOUND REPORT  Location: [facility] Date of Service: 04/19/2021   Indications:dating (Transabdominal) Findings:  Singleton intrauterine pregnancy is visualized with a CRL consistent with [redacted]w[redacted]d gestation, giving an (U/S) EDD of 10/30/2021. The (U/S) EDD is consistent with the clinically established EDD of 11/04/2021.  FHR: 172 BPM CRL measurement: 58.2 mm Yolk sac is not visualized. Amnion: not visualized   Right Ovary is normal in appearance. Left Ovary is normal appearance. Corpus luteal cyst:  is not visualized Survey of the adnexa demonstrates no adnexal masses. There is no free peritoneal fluid in the cul de sac.  Impression: 1. [redacted]w[redacted]d Viable Singleton Intrauterine pregnancy by U/S. 2. (U/S) EDD is consistent with Clinically established EDD of 11/04/2021.   There is a viable singleton gestation.  Detailed evaluation of the fetal anatomy is precluded by early gestational age.  It must be noted that a normal ultrasound particular at this early gestational age is unable to rule out fetal aneuploidy, risk of first trimester miscarriage, or anatomic birth defects.  I personally performed the ultrasound and interpreted the images.  Thomasene Mohair, MD, [facility], [facility] 04/19/2021 5:25 PM

## 2021-04-19 NOTE — Progress Notes (Signed)
Routine Prenatal Care Visit  Subjective  Kimberly Holder is a 40 y.o. [id number] at [redacted]w[redacted]d being seen today for ongoing prenatal care.  She is currently monitored for the following issues for this low-risk pregnancy and has Preventative health care; Hirsutism; PCOS (polycystic ovarian syndrome); Atypical squamous cells of undetermined significance (ASCUS) on Papanicolaou smear of cervix; Overweight (BMI 25.0-29.9); History of melasma; Supervision of high risk pregnancy, antepartum; Advanced maternal age in multigravida; Hx of preeclampsia, prior pregnancy, currently pregnant; and History of 2 cesarean sections on their problem list.  ----------------------------------------------------------------------------------- Patient reports no complaints.    . Vag. Bleeding: None.   . Leaking Fluid denies.  U/s confims EDD ----------------------------------------------------------------------------------- The following portions of the patient's history were reviewed and updated as appropriate: allergies, current medications, past family history, past medical history, past social history, past surgical history and problem list. Problem list updated.  Objective  Blood pressure 122/74, weight 166 lb (75.3 kg), last menstrual period 01/28/2021. Pregravid weight 152 lb (68.9 kg) Total Weight Gain 14 lb (6.35 kg) Urinalysis: Urine Protein    Urine Glucose    Fetal Status: Fetal Heart Rate (bpm): 172 ([place])         General:  Alert, oriented and cooperative. Patient is in no acute distress.  Skin: Skin is warm and dry. No rash noted.   Cardiovascular: Normal heart rate noted  Respiratory: Normal respiratory effort, no problems with respiration noted  Abdomen: Soft, gravid, appropriate for gestational age.       Pelvic:  Cervical exam deferred        Extremities: Normal range of motion.     Mental Status: Normal mood and affect. Normal behavior. Normal judgment and thought content.   Assessment   40 y.o.  [id number] at [redacted]w[redacted]d by  11/04/2021, by Last Menstrual Period presenting for routine prenatal visit  Plan   pregnancy 5 Problems (from 04/13/21 to present)     Problem Noted Resolved   Supervision of high risk pregnancy, antepartum 04/13/2021 by Tresea Mall, CNM No   Overview Addendum 04/19/2021  5:19 PM by Conard Novak, MD     Nursing Staff Provider  Office Location  [facility] Dating  L=11  Language  English Anatomy US    Flu Vaccine   Genetic Screen  NIPS:   TDaP vaccine    Hgb A1C or  GTT Early : BMI 28 pre pregnancy Third trimester :   Covid    LAB RESULTS   Rhogam   Blood Type     Feeding Plan Breast Antibody    Contraception  Rubella    Circumcision  RPR     Pediatrician   HBsAg     Support Person Husband Thayer Ohm HIV    Prenatal Classes  Varicella     GBS  (For PCN allergy, check sensitivities)   BTL Consent     VBAC Consent  Pap  2022 LGSIL- repeat postpartum    Hgb Electro    Pelvis Tested  CF      SMA        Works for [employer] 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 Please refer to After Visit Summary for other counseling recommendations.   Dating u/s today. See under NOTES. Labs tomorrow as lab is closed at this point today.  Anatomy u/s ordered today  Return in about 4 weeks (around 05/17/2021) for ROB.   Thomasene Mohair, MD, [facility], [facility] 04/19/2021 5:21 PM

## 2021-04-20 ENCOUNTER — Other Ambulatory Visit: Payer: 59

## 2021-04-20 DIAGNOSIS — Z113 Encounter for screening for infections with a predominantly sexual mode of transmission: Secondary | ICD-10-CM

## 2021-04-20 DIAGNOSIS — Z1379 Encounter for other screening for genetic and chromosomal anomalies: Secondary | ICD-10-CM

## 2021-04-20 DIAGNOSIS — Z369 Encounter for antenatal screening, unspecified: Secondary | ICD-10-CM

## 2021-04-20 DIAGNOSIS — O09299 Supervision of pregnancy with other poor reproductive or obstetric history, unspecified trimester: Secondary | ICD-10-CM

## 2021-04-20 DIAGNOSIS — O09521 Supervision of elderly multigravida, first trimester: Secondary | ICD-10-CM

## 2021-04-20 DIAGNOSIS — Z3A11 11 weeks gestation of pregnancy: Secondary | ICD-10-CM

## 2021-04-20 DIAGNOSIS — O0991 Supervision of high risk pregnancy, unspecified, first trimester: Secondary | ICD-10-CM

## 2021-04-20 DIAGNOSIS — O0990 Supervision of high risk pregnancy, unspecified, unspecified trimester: Secondary | ICD-10-CM

## 2021-04-20 DIAGNOSIS — Z1159 Encounter for screening for other viral diseases: Secondary | ICD-10-CM

## 2021-04-24 LAB — MATERNIT 21 PLUS CORE, BLOOD
Fetal Fraction: 7
Result (T21): NEGATIVE
Trisomy 13 (Patau syndrome): NEGATIVE
Trisomy 18 (Edwards syndrome): NEGATIVE
Trisomy 21 (Down syndrome): NEGATIVE

## 2021-04-26 ENCOUNTER — Ambulatory Visit: Payer: 59 | Admitting: Dermatology

## 2021-04-27 ENCOUNTER — Other Ambulatory Visit: Payer: Self-pay

## 2021-04-27 ENCOUNTER — Encounter: Payer: Self-pay | Admitting: Internal Medicine

## 2021-04-27 ENCOUNTER — Ambulatory Visit (INDEPENDENT_AMBULATORY_CARE_PROVIDER_SITE_OTHER): Payer: 59 | Admitting: Internal Medicine

## 2021-04-27 VITALS — BP 130/84 | HR 71 | Temp 97.6°F | Ht 62.09 in | Wt 165.2 lb

## 2021-04-27 DIAGNOSIS — Z1329 Encounter for screening for other suspected endocrine disorder: Secondary | ICD-10-CM | POA: Diagnosis not present

## 2021-04-27 DIAGNOSIS — Z13220 Encounter for screening for lipoid disorders: Secondary | ICD-10-CM

## 2021-04-27 DIAGNOSIS — Z0000 Encounter for general adult medical examination without abnormal findings: Secondary | ICD-10-CM | POA: Diagnosis not present

## 2021-04-27 DIAGNOSIS — Z1231 Encounter for screening mammogram for malignant neoplasm of breast: Secondary | ICD-10-CM | POA: Diagnosis not present

## 2021-04-27 DIAGNOSIS — E559 Vitamin D deficiency, unspecified: Secondary | ICD-10-CM | POA: Diagnosis not present

## 2021-04-27 DIAGNOSIS — R739 Hyperglycemia, unspecified: Secondary | ICD-10-CM | POA: Diagnosis not present

## 2021-04-27 DIAGNOSIS — Z1389 Encounter for screening for other disorder: Secondary | ICD-10-CM

## 2021-04-27 LAB — COMPREHENSIVE METABOLIC PANEL
ALT: 21 U/L (ref 0–35)
AST: 18 U/L (ref 0–37)
Albumin: 3.8 g/dL (ref 3.5–5.2)
Alkaline Phosphatase: 48 U/L (ref 39–117)
BUN: 7 mg/dL (ref 6–23)
CO2: 25 mEq/L (ref 19–32)
Calcium: 9 mg/dL (ref 8.4–10.5)
Chloride: 102 mEq/L (ref 96–112)
Creatinine, Ser: 0.54 mg/dL (ref 0.40–1.20)
GFR: 114.76 mL/min (ref >60.00)
Glucose, Bld: 68 mg/dL — ABNORMAL LOW (ref 70–99)
Potassium: 3.8 mEq/L (ref 3.5–5.1)
Sodium: 135 mEq/L (ref 135–145)
Total Bilirubin: 0.5 mg/dL (ref 0.2–1.2)
Total Protein: 6.7 g/dL (ref 6.0–8.3)

## 2021-04-27 LAB — LIPID PANEL
Cholesterol: 206 mg/dL — ABNORMAL HIGH (ref 0–200)
HDL: 89.9 mg/dL (ref >39.00)
LDL Cholesterol: 100 mg/dL — ABNORMAL HIGH (ref 0–99)
NonHDL: 116.07
Total CHOL/HDL Ratio: 2
Triglycerides: 79 mg/dL (ref 0.0–149.0)
VLDL: 15.8 mg/dL (ref 0.0–40.0)

## 2021-04-27 LAB — CBC WITH DIFFERENTIAL/PLATELET
Basophils Absolute: 0 10*3/uL (ref 0.0–0.1)
Basophils Relative: 0.6 % (ref 0.0–3.0)
Eosinophils Absolute: 0.1 10*3/uL (ref 0.0–0.7)
Eosinophils Relative: 0.8 % (ref 0.0–5.0)
HCT: 40.6 % (ref 36.0–46.0)
Hemoglobin: 13.6 g/dL (ref 12.0–15.0)
Lymphocytes Relative: 18.9 % (ref 12.0–46.0)
Lymphs Abs: 1.5 10*3/uL (ref 0.7–4.0)
MCHC: 33.5 g/dL (ref 30.0–36.0)
MCV: 89.9 fl (ref 78.0–100.0)
Monocytes Absolute: 0.5 10*3/uL (ref 0.1–1.0)
Monocytes Relative: 5.6 % (ref 3.0–12.0)
Neutro Abs: 6 10*3/uL (ref 1.4–7.7)
Neutrophils Relative %: 74.1 % (ref 43.0–77.0)
Platelets: 203 10*3/uL (ref 150.0–400.0)
RBC: 4.52 Mil/uL (ref 3.87–5.11)
RDW: 13.2 % (ref 11.5–15.5)
WBC: 8.1 10*3/uL (ref 4.0–10.5)

## 2021-04-27 LAB — TSH: TSH: 0.74 u[IU]/mL (ref 0.35–5.50)

## 2021-04-27 LAB — VITAMIN D 25 HYDROXY (VIT D DEFICIENCY, FRACTURES): VITD: 26.78 ng/mL — ABNORMAL LOW (ref 30.00–100.00)

## 2021-04-27 LAB — HEMOGLOBIN A1C: Hgb A1c MFr Bld: 4.9 % (ref 4.6–6.5)

## 2021-04-27 NOTE — Progress Notes (Signed)
Chief Complaint  Patient presents with   Annual Exam   Annual  1. Pregnancy due 11/04/20 vaginally C sec 10/21/20 [facility] 13 weeks with boy will name tucker Fayrene Fearing has boy arthur Verdon Cummins, and 41 y.o step daughter, and another daughter she has daughter and son and 1 step daughter with husband who is 3 y.o  Filled out form for work   Review of Systems  Constitutional:  Negative for weight loss.  HENT:  Negative for hearing loss.   Eyes:  Negative for blurred vision.  Respiratory:  Negative for shortness of breath.   Cardiovascular:  Negative for chest pain.  Gastrointestinal:  Negative for abdominal pain.  Musculoskeletal:  Negative for falls and joint pain.  Skin:  Negative for rash.  Neurological:  Negative for headaches.  Psychiatric/Behavioral:  Negative for depression.   Past Medical History:  Diagnosis Date   Atypical squamous cells of undetermined significance (ASCUS) on Papanicolaou smear of cervix    Cervical high risk HPV (human papillomavirus) test positive 2014   COVID-19 06/23/2019   x 2 from 2020 to 2022   Obesity (BMI 30-39.9)    PCOS (polycystic ovarian syndrome)    Preeclampsia    during pregnancy   Past Surgical History:  Procedure Laterality Date   CESAREAN SECTION     COLPOSCOPY W/ BIOPSY / CURETTAGE  08/01/2014   BX Neg   TONSILLECTOMY AND ADENOIDECTOMY  1996   Family History  Problem Relation Age of Onset   Arthritis Mother    Diabetes Mother        also had a pituitary adenoma   Endocrine tumor Mother        cancerous pituitary tumor per pt    Cancer Mother 90       brain, lung cancer   Hypertension Father    Other Father        benign "neck" tumor   Arthritis Brother    Diabetes Maternal Grandmother    Heart disease Maternal Grandmother    Breast cancer Neg Hx    Social History   Socioeconomic History   Marital status: Married    Spouse name: Nestor Ramp "Thayer Ohm"   Number of children: 2   Years of education: Not on file   Highest  education level: Not on file  Occupational History   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   Enjoys spending time with family.   Never smoker    Social Determinants of Corporate investment banker Strain: Not on file  Food Insecurity: Not on file  Transportation Needs: Not on file  Physical Activity: Not on file  Stress: Not on file  Social Connections: Not on file  Intimate Partner Violence: Not on file   Current Meds  Medication Sig   ASPIRIN 81 PO Take by mouth daily at 12 noon.   Prenatal Vit-Fe Fumarate-FA (PRENATAL PO) Take by mouth.   No Known Allergies Recent Results (from the past  2160 hour(s))  Beta hCG quant (ref lab)     Status: None   Collection Time: 03/10/21  3:03 PM  Result Value Ref Range   hCG Quant 44,873 mIU/mL    Comment:                      Female (Non-pregnant)    0 -     5                             (Postmenopausal)  0 -     8                      Female (Pregnant)                      Weeks of Gestation                              3                6 -    71                              4               10 -   750                              5              217 -  7138                              6              158 - 31795                              7             [phone] -[phone]                              8            32065 -[phone]                              6            71245 -[id number]                             38            25053 -[phone]                             19             37902 -[phone]                             14  13950 - 62530                             15            12039 - 70971                             16             9040 - 56451                             17             8175 - 55868                             18             8099 [phone] Results  confirmed on dilution. Roche ECLIA methodology   Cervicovaginal ancillary only     Status: None   Collection Time: 04/13/21  2:29 PM  Result Value Ref Range   Neisseria Gonorrhea Negative    Chlamydia Negative    Trichomonas Negative    Comment Normal Reference Range Trichomonas - Negative    Comment Normal Reference Ranger Chlamydia - Negative    Comment      Normal Reference Range Neisseria Gonorrhea - Negative  Urine Culture     Status: None   Collection Time: 04/13/21  2:31 PM   Specimen: Urine, Clean Catch   UC  Result Value Ref Range   Urine Culture, Routine Final report    Organism ID, Bacteria No growth   Protein / creatinine ratio, urine     Status: None   Collection Time: 04/13/21  2:31 PM  Result Value Ref Range   Creatinine, Urine 63.5 Not Estab. mg/dL   Protein, Ur 5.3 Not Estab. mg/dL   Protein/Creat Ratio 83 0 - 200 mg/g creat  MaterniT21 PLUS Core     Status: None   Collection Time: 04/20/21  9:44 AM  Result Value Ref Range   Gestation Singleton    Fetal Fraction 7%    Gestational Age>=9W Yes    Result (T21) Negative    Lab Director Comments Comment     Comment: This specimen showed an expected representation of chromosome 21, 18 and 13 material. Clinical correlation is suggested.    Approved By Comment     Comment: Roger Shelter, MD, PhD, Director, [facility]   Trisomy 21 (Down syndrome) Negative    Trisomy 18 (Edwards syndrome) Negative    Trisomy 13 (Patau syndrome) Negative    Fetal Sex Comment     Comment: Consistent with Female   Negative Predictive Value Note     Comment: The Negative Predictive Value (NPV) for trisomy 21,  18, and 13 is greater than 99%. The NPV for SCA and ESS cannot be calculated as SCA and ESS are only reported when an abnormality is detected.    Positive Predictive Value N/A    About the Test Comment     Comment: The MaterniT(R) 21 PLUS laboratory-developed test (LDT) analyzes circulating cell-free DNA from a maternal blood sample. This test is used for screening purposes and not diagnostic. Clinical correlation is recommended. Validation data on twin pregnancies is limited and the ability of this test to detect  aneuploidy in higher multiple gestations has not yet been validated.    Test Method Comment     Comment: Circulating cell-free DNA was purified from the plasma component of maternal blood. The extracted DNA was then converted into a genomic DNA library for aneuploidy analysis of chromosomes 21, 18, and 13 via next generation sequencing.[1] Optional findings based on the test order include sex chromosome aneuploidy (SCA)[2], and enhanced sequencing series (ESS)[3], which will only be reported on as an additional finding when an abnormality is detected. SCA testing includes information on X and Y representation, while ESS testing includes deletions in selected regions (22q, 15q, 11q, 8q, 5p, 4p, 1p) and trisomy of chromosomes 16 and 22.    Performance Comment     Comment: The performance characteristics of the MaterniT(R) 21 PLUS laboratory-developed test (LDT) have been determined in a clinical validation study with pregnant women at increased risk for fetal chromosomal aneuploidy.[1-4]    Performance Characteristics Note     Comment: ----------------------------------------------------------- ! Fetal Sex                      ! Accuracy: 99.4%        ! !---------------------------------------------------------! ! Region (associated syndrome)   ! Est. Sens# ! Est. Spec ! !---------------------------------------------------------! ! Trisomy 21 (Down Syndrome)     !  99.1%      ! 99.9%     ! !---------------------------------------------------------! ! Trisomy 18 (Edwards Syndrome)  ! >99.9%     ! 99.6%     ! !---------------------------------------------------------! ! Trisomy 13 (Patau Syndrome)    ! 91.7%      ! 99.7%     ! !---------------------------------------------------------! ! Sex Chromosome Aneuploidies##  ! 96.2%      ! 99.7%     ! !---------------------------------------------------------! * As reported in ISCA database nstd37 [https://www.ncbi.nlm.nih.gov/dbvar/studies/nstd37/ ] # Estimated Sensitivity. Sensitivity estimated across the observed size distribution of each syndrome [per ISCA  database nstd37] and across the range of fetal fractions observed in routine clinical NIPT. Actual sensitivity can also be influenced by other factors such as the size of the event, total sequence counts, amplification bias, or sequence bias. ## Singleton gestation only.    Limitations of the Test Comment     Comment: While the results of these tests are highly reliable, discordant results, including inaccurate fetal sex prediction, may occur due to placental, maternal, or fetal mosaicism or neoplasm; vanishing twin; prior maternal organ transplant; or other causes. These tests are screening tests and not diagnostic; they do not replace the accuracy and precision of prenatal diagnosis with CVS or amniocentesis. A patient with a positive test result should be referred for genetic counseling and offered invasive prenatal diagnosis for confirmation of test results.[5] The results of this testing, including the benefits and limitations, should be discussed with a qualified healthcare provider. Pregnancy management decisions, including termination of the pregnancy, should not be based on the results of these tests alone. The healthcare provider is responsible for the use of this information in the management of their patient.  Sex  chromosomal aneuploidies are not reportable for known mul tiple gestations. A negative result does not ensure an unaffected pregnancy nor does it exclude the possibility of other chromosomal abnormalities or birth defects which are not a part of these tests. An uninformative result may be reported, the causes of which may include, but are not limited to, insufficient sequencing coverage, noise or artifacts in the region, amplification or sequencing bias, or insufficient  fetal fraction. These tests are not intended to identify pregnancies at risk for neural tube defects or ventral wall defects. Testing for whole chromosome abnormalities (including sex chromosomes) and for subchromosomal abnormalities could lead to the potential discovery of both fetal and maternal genomic abnormalities that could have major, minor, or no, clinical significance. Evaluating the significance of a positive or a non-reportable result may involve both invasive testing and additional studies on the mother. Such investigations may lead to a diagnosis  of maternal chromosomal or subchromosomal abnormalities, which on occasion may be associated with benign or malignant maternal neoplasms. These tests may not accurately identify fetal triploidy, balanced rearrangements, or the precise location of subchromosomal duplications or deletions; these may be detected by prenatal diagnosis with CVS or amniocentesis. The ability to report results may be impacted by maternal BMI, maternal weight, maternal systemic lupus erythematosus (SLE) and/or by certain pharmaceutical agents such as low molecular weight heparin (for example: Lovenox(R), Xaparin(R), Clexane(R) and Fragmin(R)).    Note: MaterniT 21 plus Comment     Comment: [facility] is a subsidiary of [employer], using the brand [facility]. This test was developed and its performance characteristics determined by [facility]. It has not  been cleared or approved by the Food and Drug Administration. This laboratory is certified under the Clinical Laboratory Improvement Amendments (CLIA) as qualified to perform high complexity clinical laboratory testing and accredited by the College of American Pathologists (CAP). If there is future clinical need for adding MaterniT GENOME testing, this specimen will be available until term. [place] samples will not be retained beyond 60 days. [place] patients will have to send a new sample for re-sequencing (LCA Test Code: [phone]).    References Comment     Comment: 1. Palomaki GE, et al. Genet Med. 2012;14(3):296-305. 2. Mazloom AR, et al. Burnis Medin Diag. 2013;33(6):591-597. 3. Myra Rude, et al. Clin Chem. 2015 Apr;61(4):608-616. 4. Palomaki GE, et al. Genet Med. 2011;13(11):913-920. 5. ACOG/SMFM Practice Bulletin No. 226, Oct 2020.    Objective  Body mass index is 30.13 kg/m. Wt Readings from Last 3 Encounters:  04/27/21 165 lb 3.2 oz (74.9 kg)  04/19/21 166 lb (75.3 kg)  04/13/21 159 lb (72.1 kg)   Temp Readings from Last 3 Encounters:  04/27/21 97.6 F (36.4 C) (Temporal)  04/24/20 98.1 F (36.7 C) (Oral)  04/09/19 97.9 F (36.6 C) (Oral)   BP Readings from Last 3 Encounters:  04/27/21 130/84  04/19/21 122/74  04/13/21 122/80   Pulse Readings from Last 3 Encounters:  04/27/21 71  04/13/21 68  04/24/20 81    Physical Exam Vitals and nursing note reviewed.  Constitutional:      Appearance: Normal appearance. She is well-developed and well-groomed.  HENT:     Head: Normocephalic and atraumatic.  Eyes:     Conjunctiva/sclera: Conjunctivae normal.     Pupils: Pupils are equal, round, and reactive to light.  Cardiovascular:     Rate and Rhythm: Normal rate and regular rhythm.     Heart sounds: Normal heart sounds. No murmur heard. Pulmonary:     Effort: Pulmonary effort is normal.     Breath sounds: Normal breath sounds.  Skin:    General: Skin is  warm and dry.  Neurological:     General: No focal deficit present.     Mental Status: She is alert and oriented to person, place, and time. Mental status is at baseline.     Gait: Gait normal.  Psychiatric:        Attention and Perception: Attention and perception normal.        Mood and Affect: Mood and affect normal.        Speech: Speech normal.        Behavior: Behavior normal. Behavior is cooperative.        Thought Content: Thought content normal.        Cognition and Memory: Cognition and memory normal.        Judgment: Judgment normal.    Assessment  Plan  Annual physical exam - Plan: Lipid panel, Hemoglobin A1c, Comprehensive metabolic panel, CBC with Differential/Platelet, TSH, Urinalysis, Routine w reflex microscopic, Vitamin D (25 hydroxy)  Screening mammogram, encounter for - Plan: MM 3D SCREEN BREAST BILATERAL after pregnancy   Flu shot declines  Td had 12/31/15 consider Tdap with pregnancy  Consider check hep A/B/C in future ordered ob/gyn may do this at least hep B/C disc in future Covid declines    Pap neg HPV but ASCUS + 06/2018 ob/gyn 07/26/19 negative neg HPV  07/2020 lsil f/u in 1 year or after pregnancy    Mammogram had BL 35/6 screening due age 27 y.o, ordered sch 06/01/20 negative wait until after pregnancy 04/27/21    Colonoscopy age 47 y.o    rec healthy diet and exercise   Derm Dr. Kirtland Bouchard established   Provider: Dr. French Ana McLean-Scocuzza-[facility]

## 2021-04-28 ENCOUNTER — Encounter: Payer: Self-pay | Admitting: Internal Medicine

## 2021-04-28 ENCOUNTER — Other Ambulatory Visit: Payer: Self-pay | Admitting: Internal Medicine

## 2021-04-28 DIAGNOSIS — E559 Vitamin D deficiency, unspecified: Secondary | ICD-10-CM | POA: Insufficient documentation

## 2021-04-28 DIAGNOSIS — R8271 Bacteriuria: Secondary | ICD-10-CM

## 2021-04-28 LAB — URINALYSIS, ROUTINE W REFLEX MICROSCOPIC
Bilirubin Urine: NEGATIVE
Glucose, UA: NEGATIVE
Hgb urine dipstick: NEGATIVE
Hyaline Cast: NONE SEEN /LPF
Ketones, ur: NEGATIVE
Leukocytes,Ua: NEGATIVE
Nitrite: NEGATIVE
Protein, ur: NEGATIVE
RBC / HPF: NONE SEEN /HPF (ref 0–2)
Specific Gravity, Urine: 1.016 (ref 1.001–1.035)
WBC, UA: NONE SEEN /HPF (ref 0–5)
pH: 8 (ref 5.0–8.0)

## 2021-04-29 ENCOUNTER — Other Ambulatory Visit: Payer: Self-pay

## 2021-04-29 ENCOUNTER — Other Ambulatory Visit: Payer: 59

## 2021-04-29 DIAGNOSIS — R8271 Bacteriuria: Secondary | ICD-10-CM

## 2021-04-30 LAB — URINE CULTURE
MICRO NUMBER:: 12560202
Result:: NO GROWTH
SPECIMEN QUALITY:: ADEQUATE

## 2021-05-17 ENCOUNTER — Other Ambulatory Visit: Payer: Self-pay

## 2021-05-17 ENCOUNTER — Ambulatory Visit (INDEPENDENT_AMBULATORY_CARE_PROVIDER_SITE_OTHER): Payer: 59 | Admitting: Obstetrics & Gynecology

## 2021-05-17 ENCOUNTER — Encounter: Payer: Self-pay | Admitting: Obstetrics & Gynecology

## 2021-05-17 VITALS — BP 122/80 | Wt 177.0 lb

## 2021-05-17 DIAGNOSIS — Z98891 History of uterine scar from previous surgery: Secondary | ICD-10-CM

## 2021-05-17 DIAGNOSIS — O0992 Supervision of high risk pregnancy, unspecified, second trimester: Secondary | ICD-10-CM

## 2021-05-17 DIAGNOSIS — O09522 Supervision of elderly multigravida, second trimester: Secondary | ICD-10-CM

## 2021-05-17 DIAGNOSIS — Z3A15 15 weeks gestation of pregnancy: Secondary | ICD-10-CM

## 2021-05-17 DIAGNOSIS — Z131 Encounter for screening for diabetes mellitus: Secondary | ICD-10-CM

## 2021-05-17 LAB — POCT URINALYSIS DIPSTICK OB
Glucose, UA: NEGATIVE
POC,PROTEIN,UA: NEGATIVE

## 2021-05-17 NOTE — Patient Instructions (Signed)

## 2021-05-17 NOTE — Progress Notes (Signed)
  Subjective  Fetal Movement? yes Contractions? no Leaking Fluid? no Vaginal Bleeding? no  Objective  BP 122/80   Wt 177 lb (80.3 kg)   LMP 01/28/2021 (Approximate)   BMI 32.28 kg/m  General: NAD Pumonary: no increased work of breathing Abdomen: gravid, non-tender Extremities: no edema Psychiatric: mood appropriate, affect full  Assessment  39 y.o. [id number] at [redacted]w[redacted]d by  11/04/2021, by Last Menstrual Period presenting for routine prenatal visit  Plan   Problem List Items Addressed This Visit      Other   Supervision of high risk pregnancy, antepartum - Primary   Advanced maternal age in multigravida   History of 2 cesarean sections  Other Visit Diagnoses    [redacted] weeks gestation of pregnancy       Screening for diabetes mellitus       Relevant Orders   28 Week RH+Panel    PNV [place] next month Needs labs next appt (lab always closed when pt here w these appt's) Plans CS w BTL. Alternatives discussed.  pregnancy 5 Problems (from 04/13/21 to present)    Problem     Supervision of high risk pregnancy, antepartum     Overview Addendum 04/19/2021  5:19 PM by Conard Novak, MD     Nursing Staff Provider  Office Location  [facility] Dating  L=11  Language  English Anatomy US    Flu Vaccine   Genetic Screen  NIPS: nml  TDaP vaccine    Hgb A1C or  GTT Early : BMI 28 pre pregnancy Third trimester :   Covid    LAB RESULTS   Rhogam   Blood Type     Feeding Plan Breast Antibody    Contraception  Rubella    Circumcision  RPR     Pediatrician   HBsAg     Support Person Husband Thayer Ohm HIV    Prenatal Classes  Varicella     GBS  (For PCN allergy, check sensitivities)   BTL Consent     VBAC Consent  Pap  2022 LGSIL- repeat postpartum    Hgb Electro    Pelvis Tested  CF      SMA         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

## 2021-05-25 ENCOUNTER — Other Ambulatory Visit: Payer: Self-pay

## 2021-05-25 ENCOUNTER — Encounter: Payer: Self-pay | Admitting: Obstetrics

## 2021-05-25 ENCOUNTER — Ambulatory Visit (INDEPENDENT_AMBULATORY_CARE_PROVIDER_SITE_OTHER): Payer: 59 | Admitting: Obstetrics

## 2021-05-25 VITALS — BP 90/70 | Wt 177.0 lb

## 2021-05-25 DIAGNOSIS — O0992 Supervision of high risk pregnancy, unspecified, second trimester: Secondary | ICD-10-CM

## 2021-05-25 LAB — POCT URINALYSIS DIPSTICK OB
Glucose, UA: NEGATIVE
POC,PROTEIN,UA: NEGATIVE

## 2021-05-25 NOTE — Addendum Note (Signed)
Addended by: Donnetta Hail on: 05/25/2021 11:17 AM   Modules accepted: Orders

## 2021-05-25 NOTE — Progress Notes (Signed)
Routine Prenatal Care Visit  Subjective  Kimberly Holder is a 40 y.o. [id number] at [redacted]w[redacted]d being seen today for ongoing prenatal care.  She is currently monitored for the following issues for this high-risk pregnancy and has Preventative health care; Hirsutism; PCOS (polycystic ovarian syndrome); Atypical squamous cells of undetermined significance (ASCUS) on Papanicolaou smear of cervix; Overweight (BMI 25.0-29.9); History of melasma; Supervision of high risk pregnancy, antepartum; Advanced maternal age in multigravida; Hx of preeclampsia, prior pregnancy, currently pregnant; History of 2 cesarean sections; and Vitamin D deficiency on their problem list.  ----------------------------------------------------------------------------------- Patient reports anincident this morning of scant vaginal leakage that she saw on a panty liner. She felt one instance of pressure in her pelvis, then some discharge. On her arrival to the office , she denies any cramping or bleeding. She has felt flutters. No recent IC..    .  .   Jacklyn Shell Fluid admits to.  ----------------------------------------------------------------------------------- The following portions of the patient's history were reviewed and updated as appropriate: allergies, current medications, past family history, past medical history, past social history, past surgical history and problem list. Problem list updated.  Objective  Blood pressure 90/70, weight 177 lb (80.3 kg), last menstrual period 01/28/2021. Pregravid weight 152 lb (68.9 kg) Total Weight Gain 25 lb (11.3 kg) Urinalysis: Urine Protein    Urine Glucose    Fetal Status:           General:  Alert, oriented and cooperative. Patient is in no acute distress.  Skin: Skin is warm and dry. No rash noted.   Cardiovascular: Normal heart rate noted  Respiratory: Normal respiratory effort, no problems with respiration noted  Abdomen: Soft, gravid, appropriate for gestational age.        Pelvic:  Cervical exam performed      visual exam via spec only. Scant white discharge noted. No other leaking or pooling. Cervix appears closed.  Extremities: Normal range of motion.     Mental Status: Normal mood and affect. Normal behavior. Normal judgment and thought content.   Assessment   40 y.o. [id number] at [redacted]w[redacted]d by  11/04/2021, by Last Menstrual Period presenting for work-in prenatal visit  Plan   pregnancy 5 Problems (from 04/13/21 to present)    Problem Noted Resolved   Supervision of high risk pregnancy, antepartum 04/13/2021 by Rod Can, CNM No   Overview Addendum 05/17/2021  4:48 PM by Gae Dry, MD     Nursing Staff Provider  Office Location  [facility] Dating  L=11  Language  English Anatomy US    Flu Vaccine   Genetic Screen  NIPS: nml XY  TDaP vaccine    Hgb A1C or  GTT Early : BMI 28 pre pregnancy.  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 [phone] at [redacted]w[redacted]d with Estimated Date of Delivery: 11/04/21 was seen today in office to discuss trial of labor after cesarean section (TOLAC) versus elective  repeat cesarean delivery (ERCD). The following risks were discussed with the patient.  Risk of uterine rupture at term is 0.78 percent with TOLAC and 0.22 percent with ERCD. 1 in 10 uterine ruptures will result in neonatal death or neurological injury. The benefits of a trial of labor after cesarean (TOLAC) resulting in a vaginal birth after cesarean (VBAC) include the following: shorter length of hospital stay and postpartum recovery (in most cases); fewer complications, such as postpartum fever, wound or uterine infection, thromboembolism (blood clots in the leg or lung), need for blood transfusion and fewer neonatal breathing problems.  The risks of an attempted VBAC or TOLAC include the following: Risk of failed trial of labor after cesarean (TOLAC) without a vaginal birth after cesarean (VBAC) resulting in repeat cesarean delivery (RCD) in about 20 to 40 percent of women who attempt VBAC.  Risk of rupture of uterus resulting in an emergency cesarean delivery. The risk of uterine rupture may be related in part to the type of uterine incision made during the first cesarean delivery. A previous transverse uterine incision has the lowest risk of rupture (0.2 to 1.5 percent risk). Vertical or T-shaped uterine incisions have a higher risk of uterine rupture (4 to 9 percent risk)The risk of fetal death is very low with both VBAC and elective repeat cesarean delivery (ERCD), but the likelihood of fetal death is higher with VBAC than with ERCD. Maternal death is very rare with either type of delivery.  The risks of an elective repeat cesarean delivery (ERCD) were reviewed with the patient including but not limited to: 08/998 risk of uterine rupture which could have serious consequences, bleeding which may require transfusion; infection which may require antibiotics; injury to bowel, bladder or other surrounding organs (bowel, bladder, ureters); injury to the fetus; need for additional procedures including  hysterectomy in the event of a life-threatening hemorrhage; thromboembolic phenomenon; abnormal placentation; incisional problems; death and other postoperative or anesthesia complications.    Pt desires CS (w BTL)          Preterm labor symptoms and general obstetric precautions including but not limited to vaginal bleeding, contractions, leaking of fluid and fetal movement were reviewed in detail with the patient. Please refer to After Visit Summary for other counseling recommendations.  Excellent FHTS this morning. She is reassured. Her NOB labs are drawn today as well. RTC for next appt.  No follow-ups on file.  Mirna Mires, CNM  05/25/2021 11:12 AM

## 2021-05-25 NOTE — Progress Notes (Signed)
Vaginal Pressure started this morning.

## 2021-05-26 LAB — RPR+RH+ABO+RUB AB+AB SCR+CB...
Antibody Screen: NEGATIVE
HIV Screen 4th Generation wRfx: NONREACTIVE
Hematocrit: 38.8 % (ref 34.0–46.6)
Hemoglobin: 13.6 g/dL (ref 11.1–15.9)
Hepatitis B Surface Ag: NEGATIVE
MCH: 30.8 pg (ref 26.6–33.0)
MCHC: 35.1 g/dL (ref 31.5–35.7)
MCV: 88 fL (ref 79–97)
Platelets: 215 10*3/uL (ref 150–450)
RBC: 4.42 x10E6/uL (ref 3.77–5.28)
RDW: 12.2 % (ref 11.7–15.4)
RPR Ser Ql: NONREACTIVE
Rh Factor: POSITIVE
Rubella Antibodies, IGG: 1.44 index (ref >0.99)
Varicella zoster IgG: 759 index (ref >165)
WBC: 10.9 10*3/uL — ABNORMAL HIGH (ref 3.4–10.8)

## 2021-05-26 LAB — HEPATITIS C ANTIBODY: Hep C Virus Ab: 0.1 s/co ratio (ref 0.0–0.9)

## 2021-06-08 ENCOUNTER — Telehealth: Payer: Self-pay

## 2021-06-08 NOTE — Telephone Encounter (Signed)
Patient is scheduled for 1/30

## 2021-06-08 NOTE — Telephone Encounter (Signed)
Pt calling; has questions about leaking fluid.  [phone]  Pt states this preg just very different from her first two; wears panty liner b/c she leaks what she thinks is urine but it doesn't smell like urine; states has good fetal movement.  We have no availability. Doesn't want to go to ED and wait for 8hrs; doesn't want to go to [facility] or [facility]; states she has had all her children with [place] and we have never sent her anywhere else. Adv will send msg high priority to front desk to see if they can squeeze her in. Pt states that's what they did last time.

## 2021-06-09 ENCOUNTER — Encounter: Payer: Self-pay | Admitting: Licensed Practical Nurse

## 2021-06-09 ENCOUNTER — Other Ambulatory Visit: Payer: Self-pay

## 2021-06-09 ENCOUNTER — Ambulatory Visit (INDEPENDENT_AMBULATORY_CARE_PROVIDER_SITE_OTHER): Payer: 59 | Admitting: Licensed Practical Nurse

## 2021-06-09 VITALS — BP 120/70 | Ht 62.0 in | Wt 185.4 lb

## 2021-06-09 DIAGNOSIS — N898 Other specified noninflammatory disorders of vagina: Secondary | ICD-10-CM

## 2021-06-09 DIAGNOSIS — O26892 Other specified pregnancy related conditions, second trimester: Secondary | ICD-10-CM | POA: Diagnosis not present

## 2021-06-09 DIAGNOSIS — Z3A18 18 weeks gestation of pregnancy: Secondary | ICD-10-CM

## 2021-06-09 DIAGNOSIS — O99891 Other specified diseases and conditions complicating pregnancy: Secondary | ICD-10-CM

## 2021-06-09 LAB — POCT WET PREP (WET MOUNT)
Trichomonas Wet Prep HPF POC: ABSENT
WBC, Wet Prep HPF POC: ABSENT

## 2021-06-09 LAB — POCT URINALYSIS DIPSTICK OB
Glucose, UA: NEGATIVE
POC,PROTEIN,UA: NEGATIVE

## 2021-06-09 NOTE — Progress Notes (Signed)
OB Problem Visit   S: Kimberly Holder is 40 y/o [id number] at [redacted]w[redacted]d seen today for "leaking".  She has been leaking clear fluid, she wears a panty liner that she changes a few times of day out of comfort-the liner may be damp but never saturated. She has not noticed an odor, denies any bleeding or cramping. Last IC a week ago. Aside from needing to urinate often, denies any urinary symptoms.  She was evaluated for the same concern on 11/22.There has not been any changes to her discharge since then, but she is concerned this "could be something" as she is pregnant in her 14's and just worried.   O) Gen: alert, well groomed External genitalia: WNL SSE" cervix pink, closed, moderate amount of thin homogeneous white discharge present in vault. Negative pooling, Nitrazine, and ferning WET prep: few clue cells, no hyphae, budding yeast, or trich   A) Leukorrhea of pregnancy   P) vaginitis swab collected and sent Warning signs reviewed Keep next ROB   Carie Caddy, CNM  Domingo Pulse, [facility]  06/09/21  2:23 PM

## 2021-06-12 LAB — NUSWAB VAGINITIS PLUS (VG+)
Candida albicans, NAA: NEGATIVE
Candida glabrata, NAA: NEGATIVE
Chlamydia trachomatis, NAA: NEGATIVE
Neisseria gonorrhoeae, NAA: NEGATIVE
Trich vag by NAA: NEGATIVE

## 2021-06-14 ENCOUNTER — Other Ambulatory Visit: Payer: Self-pay | Admitting: Obstetrics & Gynecology

## 2021-06-14 ENCOUNTER — Telehealth: Payer: Self-pay | Admitting: Obstetrics and Gynecology

## 2021-06-14 DIAGNOSIS — O09522 Supervision of elderly multigravida, second trimester: Secondary | ICD-10-CM

## 2021-06-14 NOTE — Telephone Encounter (Signed)
Patient aware of apt date/time/location

## 2021-06-14 NOTE — Telephone Encounter (Signed)
Pt called in stating that she had an [place] on 12/14 at [facility].  That [place] was cancelled with an Error message stating that the [place] needed to be rescheduled with MFM due to  high risk pregnancy.  I asked Ashiyah about it and she has not received any info about the change.  Do you know anything about this pt needing to go to MFM ?  Please advise.

## 2021-06-16 ENCOUNTER — Other Ambulatory Visit: Payer: 59

## 2021-06-16 NOTE — Telephone Encounter (Signed)
Pt is scheduled with JEG on 12/30 at 3:50.

## 2021-06-17 ENCOUNTER — Encounter: Payer: Self-pay | Admitting: Advanced Practice Midwife

## 2021-06-17 ENCOUNTER — Ambulatory Visit (INDEPENDENT_AMBULATORY_CARE_PROVIDER_SITE_OTHER): Payer: 59 | Admitting: Advanced Practice Midwife

## 2021-06-17 ENCOUNTER — Other Ambulatory Visit: Payer: Self-pay

## 2021-06-17 VITALS — BP 120/70 | Wt 184.0 lb

## 2021-06-17 DIAGNOSIS — Z3A20 20 weeks gestation of pregnancy: Secondary | ICD-10-CM

## 2021-06-17 DIAGNOSIS — O09522 Supervision of elderly multigravida, second trimester: Secondary | ICD-10-CM

## 2021-06-17 DIAGNOSIS — O0992 Supervision of high risk pregnancy, unspecified, second trimester: Secondary | ICD-10-CM

## 2021-06-17 LAB — POCT URINALYSIS DIPSTICK OB
Glucose, UA: NEGATIVE
POC,PROTEIN,UA: NEGATIVE

## 2021-06-17 NOTE — Progress Notes (Signed)
Routine Prenatal Care Visit  Subjective  Kimberly Holder is a 40 y.o. [id number] at [redacted]w[redacted]d being seen today for ongoing prenatal care.  She is currently monitored for the following issues for this high-risk pregnancy and has Preventative health care; Hirsutism; PCOS (polycystic ovarian syndrome); Atypical squamous cells of undetermined significance (ASCUS) on Papanicolaou smear of cervix; Overweight (BMI 25.0-29.9); History of melasma; Supervision of high risk pregnancy, antepartum; Advanced maternal age in multigravida; Hx of preeclampsia, prior pregnancy, currently pregnant; History of 2 cesarean sections; and Vitamin D deficiency on their problem list.  ----------------------------------------------------------------------------------- Patient reports carpal tunnel symptoms with numbness of right hand at night.   Contractions: Not present. Vag. Bleeding: None.  Movement: Present. Leaking Fluid denies.  ----------------------------------------------------------------------------------- The following portions of the patient's history were reviewed and updated as appropriate: allergies, current medications, past family history, past medical history, past social history, past surgical history and problem list. Problem list updated.  Objective  Blood pressure 120/70, weight 184 lb (83.5 kg), last menstrual period 01/28/2021. Pregravid weight 152 lb (68.9 kg) Total Weight Gain 32 lb (14.5 kg) Urinalysis: Urine Protein Negative  Urine Glucose Negative  Fetal Status: Fetal Heart Rate (bpm): 143 Fundal Height: 20 cm Movement: Present     General:  Alert, oriented and cooperative. Patient is in no acute distress.  Skin: Skin is warm and dry. No rash noted.   Cardiovascular: Normal heart rate noted  Respiratory: Normal respiratory effort, no problems with respiration noted  Abdomen: Soft, gravid, appropriate for gestational age. Pain/Pressure: Absent     Pelvic:  Cervical exam deferred         Extremities: Normal range of motion.  Edema: None  Mental Status: Normal mood and affect. Normal behavior. Normal judgment and thought content.   Assessment   40 y.o. [id number] at [redacted]w[redacted]d by  11/04/2021, by Last Menstrual Period presenting for routine prenatal visit  Plan   pregnancy 5 Problems (from 04/13/21 to present)    Problem Noted Resolved   Supervision of high risk pregnancy, antepartum 04/13/2021 by Rod Can, CNM No   Overview Addendum 05/26/2021  5:37 PM by Imagene Riches, CNM     Nursing Staff Provider  Office Location  [facility] Dating  L=11  Language  English Anatomy US    Flu Vaccine   Genetic Screen  NIPS: nml XY  TDaP vaccine    Hgb A1C or  GTT Early : BMI 28 pre pregnancy.  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 [phone] at [redacted]w[redacted]d with Estimated Date of Delivery: 11/04/21 was seen today in office to discuss trial of labor after cesarean section (TOLAC) versus elective repeat cesarean delivery (ERCD). The following risks were discussed with the  patient.  Risk of uterine rupture at term is 0.78 percent with TOLAC and 0.22 percent with ERCD. 1 in 10 uterine ruptures will result in neonatal death or neurological injury. The benefits of a trial of labor after cesarean (TOLAC) resulting in a vaginal birth after cesarean (VBAC) include the following: shorter length of hospital stay and postpartum recovery (in most cases); fewer complications, such as postpartum fever, wound or uterine infection, thromboembolism (blood clots in the leg or lung), need for blood transfusion and fewer neonatal breathing problems.  The risks of an attempted VBAC or TOLAC include the following: Risk of failed trial of labor after cesarean (TOLAC) without a vaginal birth after cesarean (VBAC) resulting in repeat cesarean delivery (RCD) in about 20 to 40 percent of women who attempt VBAC.  Risk of rupture of uterus resulting in an emergency cesarean delivery. The risk of uterine rupture may be related in part to the type of uterine incision made during the first cesarean delivery. A previous transverse uterine incision has the lowest risk of rupture (0.2 to 1.5 percent risk). Vertical or T-shaped uterine incisions have a higher risk of uterine rupture (4 to 9 percent risk)The risk of fetal death is very low with both VBAC and elective repeat cesarean delivery (ERCD), but the likelihood of fetal death is higher with VBAC than with ERCD. Maternal death is very rare with either type of delivery.  The risks of an elective repeat cesarean delivery (ERCD) were reviewed with the patient including but not limited to: 08/998 risk of uterine rupture which could have serious consequences, bleeding which may require transfusion; infection which may require antibiotics; injury to bowel, bladder or other surrounding organs (bowel, bladder, ureters); injury to the fetus; need for additional procedures including hysterectomy in the event of a life-threatening hemorrhage; thromboembolic  phenomenon; abnormal placentation; incisional problems; death and other postoperative or anesthesia complications.    Pt desires CS (w BTL)          Preterm labor symptoms and general obstetric precautions including but not limited to vaginal bleeding, contractions, leaking of fluid and fetal movement were reviewed in detail with the patient.   Return in about 4 weeks (around 07/15/2021) for rob.  Tresea Mall, CNM 06/17/2021 4:46 PM

## 2021-06-30 ENCOUNTER — Other Ambulatory Visit: Payer: Self-pay

## 2021-06-30 ENCOUNTER — Other Ambulatory Visit: Payer: Self-pay | Admitting: *Deleted

## 2021-06-30 ENCOUNTER — Ambulatory Visit: Payer: 59 | Attending: Obstetrics & Gynecology

## 2021-06-30 ENCOUNTER — Ambulatory Visit: Payer: 59 | Admitting: *Deleted

## 2021-06-30 VITALS — BP 121/70 | HR 103

## 2021-06-30 DIAGNOSIS — O0990 Supervision of high risk pregnancy, unspecified, unspecified trimester: Secondary | ICD-10-CM

## 2021-06-30 DIAGNOSIS — O09522 Supervision of elderly multigravida, second trimester: Secondary | ICD-10-CM

## 2021-06-30 DIAGNOSIS — O09299 Supervision of pregnancy with other poor reproductive or obstetric history, unspecified trimester: Secondary | ICD-10-CM | POA: Diagnosis present

## 2021-06-30 DIAGNOSIS — Z98891 History of uterine scar from previous surgery: Secondary | ICD-10-CM | POA: Insufficient documentation

## 2021-06-30 DIAGNOSIS — Z8742 Personal history of other diseases of the female genital tract: Secondary | ICD-10-CM

## 2021-06-30 DIAGNOSIS — O09292 Supervision of pregnancy with other poor reproductive or obstetric history, second trimester: Secondary | ICD-10-CM

## 2021-06-30 DIAGNOSIS — O34219 Maternal care for unspecified type scar from previous cesarean delivery: Secondary | ICD-10-CM

## 2021-07-02 ENCOUNTER — Ambulatory Visit (INDEPENDENT_AMBULATORY_CARE_PROVIDER_SITE_OTHER): Payer: 59 | Admitting: Advanced Practice Midwife

## 2021-07-02 ENCOUNTER — Encounter: Payer: Self-pay | Admitting: Advanced Practice Midwife

## 2021-07-02 ENCOUNTER — Other Ambulatory Visit: Payer: Self-pay

## 2021-07-02 VITALS — BP 110/68 | Wt 190.0 lb

## 2021-07-02 DIAGNOSIS — O09522 Supervision of elderly multigravida, second trimester: Secondary | ICD-10-CM

## 2021-07-02 DIAGNOSIS — O09299 Supervision of pregnancy with other poor reproductive or obstetric history, unspecified trimester: Secondary | ICD-10-CM

## 2021-07-02 DIAGNOSIS — Z3A22 22 weeks gestation of pregnancy: Secondary | ICD-10-CM

## 2021-07-02 DIAGNOSIS — O0992 Supervision of high risk pregnancy, unspecified, second trimester: Secondary | ICD-10-CM

## 2021-07-02 LAB — POCT URINALYSIS DIPSTICK OB
Glucose, UA: NEGATIVE
POC,PROTEIN,UA: NEGATIVE

## 2021-07-02 NOTE — Progress Notes (Signed)
Routine Prenatal Care Visit  Subjective  Kimberly Holder is a 40 y.o. [id number] at [redacted]w[redacted]d being seen today for ongoing prenatal care.  She is currently monitored for the following issues for this high-risk pregnancy and has Preventative health care; Hirsutism; PCOS (polycystic ovarian syndrome); Atypical squamous cells of undetermined significance (ASCUS) on Papanicolaou smear of cervix; Overweight (BMI 25.0-29.9); History of melasma; Supervision of high risk pregnancy, antepartum; Advanced maternal age in multigravida; Hx of preeclampsia, prior pregnancy, currently pregnant; History of 2 cesarean sections; and Vitamin D deficiency on their problem list.  ----------------------------------------------------------------------------------- Patient reports no complaints.  We reviewed results of recent MFM scan; borderline polyhydramnios, fetal growth > 99%, incomplete anatomy. She has follow up scan with them in January. 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  Contractions: Not present. Vag. Bleeding: None.  Movement: Present. Leaking Fluid denies.  ----------------------------------------------------------------------------------- The following portions of the patient's history were reviewed and updated as appropriate: allergies, current medications, past family history, past medical history, past social history, past surgical history and problem list. Problem list updated.  Objective  Blood pressure 110/68, weight 190 lb (86.2 kg), last menstrual period 01/28/2021. Pregravid weight 152 lb (68.9 kg) Total Weight Gain 38 lb (17.2 kg) Urinalysis: Urine Protein Negative  Urine Glucose Negative  Fetal Status: Fetal Heart Rate (bpm): 147 Fundal Height: 25 cm Movement: Present     General:  Alert, oriented and cooperative. Patient is in no acute distress.  Skin: Skin is warm and dry.  No rash noted.   Cardiovascular: Normal heart rate noted  Respiratory: Normal respiratory effort, no problems with respiration noted  Abdomen: Soft, gravid, appropriate for gestational age. Pain/Pressure: Absent     Pelvic:  Cervical exam deferred        Extremities: Normal range of motion.  Edema: None  Mental Status: Normal mood and affect. Normal behavior. Normal judgment and thought content.   Assessment   40 y.o. [id number] at [redacted]w[redacted]d by  11/04/2021, by Last Menstrual Period presenting for routine prenatal visit  Plan   pregnancy 5 Problems (from 04/13/21 to present)    Problem Noted Resolved   Supervision of high risk pregnancy, antepartum 04/13/2021 by Tresea Mall, CNM No   Overview Addendum 05/26/2021  5:37 PM by Mirna Mires, CNM     Nursing Staff Provider  Office Location  [facility] Dating  L=11  Language  English Anatomy US    Flu Vaccine   Genetic Screen  NIPS: nml XY  TDaP vaccine    Hgb A1C or  GTT Early : BMI 28 pre pregnancy.  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 [phone] at [redacted]w[redacted]d with Estimated Date of Delivery: 11/04/21 was seen today in office to discuss trial of labor after cesarean section (TOLAC) versus elective repeat cesarean delivery (ERCD). The following risks were discussed with the patient.  Risk of uterine rupture at term is 0.78 percent with TOLAC and 0.22 percent with ERCD. 1 in 10 uterine ruptures will result in neonatal death or neurological injury. The benefits of a trial of labor after cesarean (TOLAC) resulting in a vaginal birth after cesarean (VBAC) include the following: shorter length of hospital stay and postpartum recovery (in most cases); fewer complications, such as postpartum fever, wound or uterine infection, thromboembolism (blood clots in the leg or lung), need for blood transfusion and fewer neonatal breathing problems.  The risks of an attempted VBAC or TOLAC include the following: Risk of failed trial of labor after cesarean (TOLAC) without a vaginal birth after cesarean (VBAC) resulting in repeat cesarean delivery (RCD) in about 20 to 79 percent of women who attempt VBAC.  Risk of rupture of uterus resulting in an emergency cesarean delivery. The risk of uterine rupture may be related in part to the type of uterine incision made during the first cesarean delivery. A previous transverse uterine incision has the lowest risk of rupture (0.2 to 1.5 percent risk). Vertical or T-shaped uterine incisions have a higher risk of uterine rupture (4 to 9 percent risk)The risk of fetal death is very low with both VBAC and elective repeat cesarean delivery (ERCD), but the likelihood of fetal death is higher with VBAC than with ERCD. Maternal death is very rare with either type of delivery.  The risks of an elective repeat cesarean delivery (ERCD) were reviewed with the patient including but not limited to: 08/998 risk of uterine rupture which could have serious consequences, bleeding which may require transfusion;  infection which may require antibiotics; injury to bowel, bladder or other surrounding organs (bowel, bladder, ureters); injury to the fetus; need for additional procedures including hysterectomy in the event of a life-threatening hemorrhage; thromboembolic phenomenon; abnormal placentation; incisional problems; death and other postoperative or anesthesia complications.    Pt desires CS (w BTL)          Preterm labor symptoms and general obstetric precautions including but not limited to vaginal bleeding, contractions, leaking of fluid and fetal movement were reviewed in detail with the patient.   Return in about 4 weeks (around 07/30/2021) for 28 wk labs (was future ordered) and rob.  Rod Can, CNM 07/02/2021 4:20 PM

## 2021-07-02 NOTE — Progress Notes (Signed)
ROB - [facility] [place] 06/30/21. RM 4

## 2021-07-06 ENCOUNTER — Encounter: Payer: Self-pay | Admitting: Advanced Practice Midwife

## 2021-07-06 ENCOUNTER — Telehealth: Payer: Self-pay

## 2021-07-06 NOTE — Telephone Encounter (Signed)
Faxed.  Pt aware by vm.

## 2021-07-06 NOTE — Telephone Encounter (Signed)
Pt calling for note to be sent to her dentist; they are trying to charge her for a cancellation fee; she has changed her appt to May - after delivery; someone told her that her gums would bleed more if has cleaning this late in her pregnancy.  Fax # to send it to is:  [phone]  Pt's # [phone]

## 2021-07-07 ENCOUNTER — Encounter: Payer: 59 | Admitting: Advanced Practice Midwife

## 2021-07-19 ENCOUNTER — Other Ambulatory Visit: Payer: Self-pay

## 2021-07-19 ENCOUNTER — Encounter: Payer: 59 | Admitting: Obstetrics and Gynecology

## 2021-07-19 ENCOUNTER — Ambulatory Visit (INDEPENDENT_AMBULATORY_CARE_PROVIDER_SITE_OTHER): Payer: 59 | Admitting: Obstetrics

## 2021-07-19 VITALS — BP 118/74 | Wt 196.0 lb

## 2021-07-19 DIAGNOSIS — O0992 Supervision of high risk pregnancy, unspecified, second trimester: Secondary | ICD-10-CM

## 2021-07-19 DIAGNOSIS — Z3A24 24 weeks gestation of pregnancy: Secondary | ICD-10-CM

## 2021-07-19 NOTE — Progress Notes (Signed)
No vb. No lof.  

## 2021-07-19 NOTE — Progress Notes (Signed)
Routine Prenatal Care Visit  Subjective  Kimberly Holder is a 41 y.o. [id number] at [redacted]w[redacted]d being seen today for ongoing prenatal care.  She is currently monitored for the following issues for this high-risk pregnancy and has Preventative health care; Hirsutism; PCOS (polycystic ovarian syndrome); Atypical squamous cells of undetermined significance (ASCUS) on Papanicolaou smear of cervix; Overweight (BMI 25.0-29.9); History of melasma; Supervision of high risk pregnancy, antepartum; Advanced maternal age in multigravida; Hx of preeclampsia, prior pregnancy, currently pregnant; History of 2 cesarean sections; and Vitamin D deficiency on their problem list.  ----------------------------------------------------------------------------------- Patient reports no complaints.  Of note, she has gained 44 lbs thus far. Her recent growth scan shows  Growth at >99%. Contractions: Not present. Vag. Bleeding: None.   . Leaking Fluid denies.  ----------------------------------------------------------------------------------- The following portions of the patient's history were reviewed and updated as appropriate: allergies, current medications, past family history, past medical history, past social history, past surgical history and problem list. Problem list updated.  Objective  Blood pressure 118/74, weight 196 lb (88.9 kg), last menstrual period 01/28/2021. Pregravid weight 152 lb (68.9 kg) Total Weight Gain 44 lb (20 kg) Urinalysis: Urine Protein    Urine Glucose    Fetal Status:           General:  Alert, oriented and cooperative. Patient is in no acute distress.  Skin: Skin is warm and dry. No rash noted.   Cardiovascular: Normal heart rate noted  Respiratory: Normal respiratory effort, no problems with respiration noted  Abdomen: Soft, gravid, appropriate for gestational age. Pain/Pressure: Absent     Pelvic:  Cervical exam deferred        Extremities: Normal range of motion.     Mental Status: Normal  mood and affect. Normal behavior. Normal judgment and thought content.   Assessment   41 y.o. [id number] at [redacted]w[redacted]d by  11/04/2021, by Last Menstrual Period presenting for routine prenatal visit  Plan   pregnancy 5 Problems (from 04/13/21 to present)    Problem Noted Resolved   Supervision of high risk pregnancy, antepartum 04/13/2021 by Tresea Mall, CNM No   Overview Addendum 05/26/2021  5:37 PM by Mirna Mires, CNM     Nursing Staff Provider  Office Location  [facility] Dating  L=11  Language  English Anatomy US    Flu Vaccine   Genetic Screen  NIPS: nml XY  TDaP vaccine    Hgb A1C or  GTT Early : BMI 28 pre pregnancy.  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 [phone] at [redacted]w[redacted]d with Estimated Date of Delivery: 11/04/21 was seen today in office to discuss trial of labor after cesarean section (TOLAC) versus elective repeat cesarean delivery (ERCD). The following risks were discussed with the patient.  Risk of uterine rupture at term is 0.78 percent with TOLAC and  0.22 percent with ERCD. 1 in 10 uterine ruptures will result in neonatal death or neurological injury. The benefits of a trial of labor after cesarean (TOLAC) resulting in a vaginal birth after cesarean (VBAC) include the following: shorter length of hospital stay and postpartum recovery (in most cases); fewer complications, such as postpartum fever, wound or uterine infection, thromboembolism (blood clots in the leg or lung), need for blood transfusion and fewer neonatal breathing problems.  The risks of an attempted VBAC or TOLAC include the following: Risk of failed trial of labor after cesarean (TOLAC) without a vaginal birth after cesarean (VBAC) resulting in repeat cesarean delivery (RCD) in about 20 to 30 percent of women who attempt VBAC.  Risk of rupture of uterus resulting in an emergency cesarean delivery. The risk of uterine rupture may be related in part to the type of uterine incision made during the first cesarean delivery. A previous transverse uterine incision has the lowest risk of rupture (0.2 to 1.5 percent risk). Vertical or T-shaped uterine incisions have a higher risk of uterine rupture (4 to 9 percent risk)The risk of fetal death is very low with both VBAC and elective repeat cesarean delivery (ERCD), but the likelihood of fetal death is higher with VBAC than with ERCD. Maternal death is very rare with either type of delivery.  The risks of an elective repeat cesarean delivery (ERCD) were reviewed with the patient including but not limited to: 08/998 risk of uterine rupture which could have serious consequences, bleeding which may require transfusion; infection which may require antibiotics; injury to bowel, bladder or other surrounding organs (bowel, bladder, ureters); injury to the fetus; need for additional procedures including hysterectomy in the event of a life-threatening hemorrhage; thromboembolic phenomenon; abnormal placentation; incisional problems; death and other  postoperative or anesthesia complications.    Pt desires CS (w BTL)          Preterm labor symptoms and general obstetric precautions including but not limited to vaginal bleeding, contractions, leaking of fluid and fetal movement were reviewed in detail with the patient. Please refer to After Visit Summary for other counseling recommendations.  Discussed her TWG- 44 lbs this pregnancy- dietary guidelines suggested.  Return in about 4 weeks (around 08/16/2021) for return OB, 28 week labs (glucose testing).  Imagene Riches, CNM  07/19/2021 4:00 PM

## 2021-07-29 ENCOUNTER — Other Ambulatory Visit: Payer: Self-pay | Admitting: Obstetrics

## 2021-07-29 ENCOUNTER — Ambulatory Visit: Payer: 59 | Admitting: *Deleted

## 2021-07-29 ENCOUNTER — Encounter: Payer: Self-pay | Admitting: *Deleted

## 2021-07-29 ENCOUNTER — Ambulatory Visit: Payer: 59 | Attending: Obstetrics and Gynecology

## 2021-07-29 ENCOUNTER — Other Ambulatory Visit: Payer: Self-pay | Admitting: *Deleted

## 2021-07-29 ENCOUNTER — Telehealth: Payer: Self-pay

## 2021-07-29 ENCOUNTER — Other Ambulatory Visit: Payer: Self-pay

## 2021-07-29 VITALS — BP 111/63 | HR 73

## 2021-07-29 DIAGNOSIS — O34219 Maternal care for unspecified type scar from previous cesarean delivery: Secondary | ICD-10-CM

## 2021-07-29 DIAGNOSIS — O09522 Supervision of elderly multigravida, second trimester: Secondary | ICD-10-CM

## 2021-07-29 DIAGNOSIS — O09299 Supervision of pregnancy with other poor reproductive or obstetric history, unspecified trimester: Secondary | ICD-10-CM

## 2021-07-29 DIAGNOSIS — O409XX0 Polyhydramnios, unspecified trimester, not applicable or unspecified: Secondary | ICD-10-CM

## 2021-07-29 DIAGNOSIS — O09292 Supervision of pregnancy with other poor reproductive or obstetric history, second trimester: Secondary | ICD-10-CM | POA: Insufficient documentation

## 2021-07-29 DIAGNOSIS — O0992 Supervision of high risk pregnancy, unspecified, second trimester: Secondary | ICD-10-CM

## 2021-07-29 DIAGNOSIS — O0990 Supervision of high risk pregnancy, unspecified, unspecified trimester: Secondary | ICD-10-CM

## 2021-07-29 DIAGNOSIS — Z98891 History of uterine scar from previous surgery: Secondary | ICD-10-CM

## 2021-07-29 DIAGNOSIS — Z3A26 26 weeks gestation of pregnancy: Secondary | ICD-10-CM

## 2021-07-29 DIAGNOSIS — Z8742 Personal history of other diseases of the female genital tract: Secondary | ICD-10-CM | POA: Insufficient documentation

## 2021-07-29 DIAGNOSIS — O3662X0 Maternal care for excessive fetal growth, second trimester, not applicable or unspecified: Secondary | ICD-10-CM

## 2021-07-29 NOTE — Telephone Encounter (Signed)
Pt calling; is 26wks; saw MFM today for 2nd u/s; was told baby is measuring large and she has a lot of amniotic fluid; MFM adv her to have her glucose test moved up frm mid Feb to be sure there is nothing going on from a diabetes standpoint.  [phone]

## 2021-07-30 ENCOUNTER — Other Ambulatory Visit: Payer: 59

## 2021-07-30 ENCOUNTER — Encounter: Payer: 59 | Admitting: Licensed Practical Nurse

## 2021-08-03 ENCOUNTER — Other Ambulatory Visit: Payer: 59

## 2021-08-03 ENCOUNTER — Other Ambulatory Visit: Payer: Self-pay

## 2021-08-03 DIAGNOSIS — O0992 Supervision of high risk pregnancy, unspecified, second trimester: Secondary | ICD-10-CM

## 2021-08-04 LAB — 28 WEEK RH+PANEL
Basophils Absolute: 0 10*3/uL (ref 0.0–0.2)
Basos: 1 %
EOS (ABSOLUTE): 0.1 10*3/uL (ref 0.0–0.4)
Eos: 1 %
Gestational Diabetes Screen: 136 mg/dL (ref 70–139)
HIV Screen 4th Generation wRfx: NONREACTIVE
Hematocrit: 38.8 % (ref 34.0–46.6)
Hemoglobin: 13 g/dL (ref 11.1–15.9)
Immature Grans (Abs): 0 10*3/uL (ref 0.0–0.1)
Immature Granulocytes: 1 %
Lymphocytes Absolute: 1.3 10*3/uL (ref 0.7–3.1)
Lymphs: 16 %
MCH: 30.2 pg (ref 26.6–33.0)
MCHC: 33.5 g/dL (ref 31.5–35.7)
MCV: 90 fL (ref 79–97)
Monocytes Absolute: 0.4 10*3/uL (ref 0.1–0.9)
Monocytes: 5 %
Neutrophils Absolute: 6.6 10*3/uL (ref 1.4–7.0)
Neutrophils: 76 %
Platelets: 195 10*3/uL (ref 150–450)
RBC: 4.31 x10E6/uL (ref 3.77–5.28)
RDW: 12.5 % (ref 11.7–15.4)
RPR Ser Ql: NONREACTIVE
WBC: 8.4 10*3/uL (ref 3.4–10.8)

## 2021-08-19 ENCOUNTER — Ambulatory Visit (INDEPENDENT_AMBULATORY_CARE_PROVIDER_SITE_OTHER): Payer: 59 | Admitting: Obstetrics

## 2021-08-19 ENCOUNTER — Other Ambulatory Visit: Payer: Self-pay

## 2021-08-19 ENCOUNTER — Other Ambulatory Visit: Payer: 59

## 2021-08-19 VITALS — BP 122/60 | Wt 193.0 lb

## 2021-08-19 DIAGNOSIS — Z3A29 29 weeks gestation of pregnancy: Secondary | ICD-10-CM

## 2021-08-19 DIAGNOSIS — Z3480 Encounter for supervision of other normal pregnancy, unspecified trimester: Secondary | ICD-10-CM

## 2021-08-19 LAB — POCT URINALYSIS DIPSTICK OB
Glucose, UA: NEGATIVE
POC,PROTEIN,UA: NEGATIVE

## 2021-08-19 NOTE — Addendum Note (Signed)
Addended by: Liliane Shi on: 08/19/2021 09:09 AM   Modules accepted: Orders

## 2021-08-19 NOTE — Progress Notes (Signed)
Routine Prenatal Care Visit  Subjective  Kimberly Holder is a 41 y.o. [id number] at [redacted]w[redacted]d being seen today for ongoing prenatal care.  She is currently monitored for the following issues for this high-risk pregnancy and has Preventative health care; Hirsutism; PCOS (polycystic ovarian syndrome); Atypical squamous cells of undetermined significance (ASCUS) on Papanicolaou smear of cervix; Overweight (BMI 25.0-29.9); History of melasma; Supervision of high risk pregnancy, antepartum; Advanced maternal age in multigravida; Hx of preeclampsia, prior pregnancy, currently pregnant; History of 2 cesarean sections; and Vitamin D deficiency on their problem list.  ----------------------------------------------------------------------------------- Patient reports some left sided sciatic pain..   Contractions: Not present. Vag. Bleeding: None.  Movement: Present. Leaking Fluid denies.  ----------------------------------------------------------------------------------- The following portions of the patient's history were reviewed and updated as appropriate: allergies, current medications, past family history, past medical history, past social history, past surgical history and problem list. Problem list updated.  Objective  Blood pressure 122/60, weight 193 lb (87.5 kg), last menstrual period 01/28/2021. Pregravid weight 152 lb (68.9 kg) Total Weight Gain 41 lb (18.6 kg) Urinalysis: Urine Protein    Urine Glucose    Fetal Status:     Movement: Present     General:  Alert, oriented and cooperative. Patient is in no acute distress.  Skin: Skin is warm and dry. No rash noted.   Cardiovascular: Normal heart rate noted  Respiratory: Normal respiratory effort, no problems with respiration noted  Abdomen: Soft, gravid, appropriate for gestational age. Pain/Pressure: Absent     Pelvic:  Cervical exam deferred        Extremities: Normal range of motion.     Mental Status: Normal mood and affect. Normal behavior.  Normal judgment and thought content.   Assessment   41 y.o. [id number] at [redacted]w[redacted]d by  11/04/2021, by Last Menstrual Period presenting for routine prenatal visit  Plan   pregnancy 5 Problems (from 04/13/21 to present)    Problem Noted Resolved   Supervision of high risk pregnancy, antepartum 04/13/2021 by Rod Can, CNM No   Overview Addendum 08/04/2021  1:40 PM by Imagene Riches, CNM     Nursing Staff Provider  Office Location  [facility] Dating  L=11  Language  English Anatomy US    Flu Vaccine   Genetic Screen  NIPS: nml XY  TDaP vaccine    Hgb A1C or  GTT Early : BMI 28 pre pregnancy.  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 [phone] at [redacted]w[redacted]d with Estimated Date of Delivery: 11/04/21 was seen today in office to discuss trial of labor after cesarean section (TOLAC) versus elective repeat cesarean delivery (ERCD). The following risks were discussed with the patient.  Risk of uterine rupture at term is 0.78 percent with TOLAC and 0.22 percent with ERCD. 1 in 10  uterine ruptures will result in neonatal death or neurological injury. The benefits of a trial of labor after cesarean (TOLAC) resulting in a vaginal birth after cesarean (VBAC) include the following: shorter length of hospital stay and postpartum recovery (in most cases); fewer complications, such as postpartum fever, wound or uterine infection, thromboembolism (blood clots in the leg or lung), need for blood transfusion and fewer neonatal breathing problems.  The risks of an attempted VBAC or TOLAC include the following: Risk of failed trial of labor after cesarean (TOLAC) without a vaginal birth after cesarean (VBAC) resulting in repeat cesarean delivery (RCD) in about 20 to 95 percent of women who attempt VBAC.  Risk of rupture of uterus resulting in an emergency cesarean delivery. The risk of uterine rupture may be related in part to the type of uterine incision made during the first cesarean delivery. A previous transverse uterine incision has the lowest risk of rupture (0.2 to 1.5 percent risk). Vertical or T-shaped uterine incisions have a higher risk of uterine rupture (4 to 9 percent risk)The risk of fetal death is very low with both VBAC and elective repeat cesarean delivery (ERCD), but the likelihood of fetal death is higher with VBAC than with ERCD. Maternal death is very rare with either type of delivery.  The risks of an elective repeat cesarean delivery (ERCD) were reviewed with the patient including but not limited to: 08/998 risk of uterine rupture which could have serious consequences, bleeding which may require transfusion; infection which may require antibiotics; injury to bowel, bladder or other surrounding organs (bowel, bladder, ureters); injury to the fetus; need for additional procedures including hysterectomy in the event of a life-threatening hemorrhage; thromboembolic phenomenon; abnormal placentation; incisional problems; death and other postoperative or anesthesia complications.     Pt desires CS (w BTL)          Preterm labor symptoms and general obstetric precautions including but not limited to vaginal bleeding, contractions, leaking of fluid and fetal movement were reviewed in detail with the patient. Please refer to After Visit Summary for other counseling recommendations.  Reviewed her 28 week labs. She is working to eat healthier and has dropped 3 lbs.  Return in about 2 weeks (around 09/02/2021) for return OB.  Imagene Riches, CNM  08/19/2021 8:22 AM

## 2021-08-26 ENCOUNTER — Other Ambulatory Visit: Payer: Self-pay | Admitting: Licensed Practical Nurse

## 2021-08-26 ENCOUNTER — Other Ambulatory Visit: Payer: Self-pay

## 2021-08-26 ENCOUNTER — Ambulatory Visit: Payer: 59 | Attending: Obstetrics

## 2021-08-26 ENCOUNTER — Ambulatory Visit: Payer: 59 | Admitting: *Deleted

## 2021-08-26 ENCOUNTER — Encounter: Payer: Self-pay | Admitting: *Deleted

## 2021-08-26 ENCOUNTER — Other Ambulatory Visit: Payer: Self-pay | Admitting: *Deleted

## 2021-08-26 ENCOUNTER — Encounter: Payer: Self-pay | Admitting: Licensed Practical Nurse

## 2021-08-26 ENCOUNTER — Telehealth: Payer: Self-pay

## 2021-08-26 VITALS — BP 117/74 | HR 75

## 2021-08-26 DIAGNOSIS — O0990 Supervision of high risk pregnancy, unspecified, unspecified trimester: Secondary | ICD-10-CM

## 2021-08-26 DIAGNOSIS — O09299 Supervision of pregnancy with other poor reproductive or obstetric history, unspecified trimester: Secondary | ICD-10-CM

## 2021-08-26 DIAGNOSIS — O09522 Supervision of elderly multigravida, second trimester: Secondary | ICD-10-CM

## 2021-08-26 DIAGNOSIS — O09523 Supervision of elderly multigravida, third trimester: Secondary | ICD-10-CM | POA: Diagnosis not present

## 2021-08-26 DIAGNOSIS — O3662X0 Maternal care for excessive fetal growth, second trimester, not applicable or unspecified: Secondary | ICD-10-CM | POA: Diagnosis not present

## 2021-08-26 DIAGNOSIS — O09293 Supervision of pregnancy with other poor reproductive or obstetric history, third trimester: Secondary | ICD-10-CM | POA: Diagnosis not present

## 2021-08-26 DIAGNOSIS — O403XX0 Polyhydramnios, third trimester, not applicable or unspecified: Secondary | ICD-10-CM

## 2021-08-26 DIAGNOSIS — Z98891 History of uterine scar from previous surgery: Secondary | ICD-10-CM | POA: Diagnosis present

## 2021-08-26 DIAGNOSIS — O3663X1 Maternal care for excessive fetal growth, third trimester, fetus 1: Secondary | ICD-10-CM

## 2021-08-26 DIAGNOSIS — O34219 Maternal care for unspecified type scar from previous cesarean delivery: Secondary | ICD-10-CM

## 2021-08-26 DIAGNOSIS — O3663X0 Maternal care for excessive fetal growth, third trimester, not applicable or unspecified: Secondary | ICD-10-CM

## 2021-08-26 DIAGNOSIS — O409XX0 Polyhydramnios, unspecified trimester, not applicable or unspecified: Secondary | ICD-10-CM

## 2021-08-26 DIAGNOSIS — Z3A30 30 weeks gestation of pregnancy: Secondary | ICD-10-CM

## 2021-08-26 DIAGNOSIS — Z131 Encounter for screening for diabetes mellitus: Secondary | ICD-10-CM

## 2021-08-26 DIAGNOSIS — O99213 Obesity complicating pregnancy, third trimester: Secondary | ICD-10-CM

## 2021-08-26 DIAGNOSIS — O09513 Supervision of elderly primigravida, third trimester: Secondary | ICD-10-CM

## 2021-08-26 HISTORY — DX: Polyhydramnios, third trimester, not applicable or unspecified: O40.3XX0

## 2021-08-26 NOTE — Telephone Encounter (Signed)
Contacted patient via phone to schedule 3 GTT. No answer, No voicemail .

## 2021-08-26 NOTE — Progress Notes (Signed)
Seen by MFM on 2/23, LGA with severe polyhydramnios.  Per MFM pt should have 3 hour glucose test.  Pt agreeable to plan. Pt called, appointment made.  Roberto Scales, CNM  Mosetta Pigeon, [facility]  08/26/21  11:40 AM

## 2021-08-26 NOTE — Telephone Encounter (Signed)
Patient is scheduled 09/02/21

## 2021-08-30 ENCOUNTER — Telehealth: Payer: Self-pay

## 2021-08-30 NOTE — Telephone Encounter (Signed)
Pt calling; is 31wks; had u/s last week; MFM said projected due date is 40wks on 11/04/21 and wants c/s done 10/28/21; but pt is retaining fluid and the baby is big; will her c/s be moved up?  [phone]  Adv pt this will be better discussed at appt Thursday; to write down all her questions and bring them with her; usually dates are not changed unless there is a week or more difference; pt states dates are about 2 1/2 wks off.  Reiterated to discuss at next appt on Thurs.

## 2021-09-02 ENCOUNTER — Ambulatory Visit (INDEPENDENT_AMBULATORY_CARE_PROVIDER_SITE_OTHER): Payer: 59 | Admitting: Advanced Practice Midwife

## 2021-09-02 ENCOUNTER — Other Ambulatory Visit: Payer: Self-pay

## 2021-09-02 ENCOUNTER — Encounter: Payer: 59 | Admitting: Licensed Practical Nurse

## 2021-09-02 ENCOUNTER — Encounter: Payer: Self-pay | Admitting: Advanced Practice Midwife

## 2021-09-02 ENCOUNTER — Other Ambulatory Visit: Payer: 59

## 2021-09-02 VITALS — BP 120/80 | Wt 197.0 lb

## 2021-09-02 DIAGNOSIS — O3660X0 Maternal care for excessive fetal growth, unspecified trimester, not applicable or unspecified: Secondary | ICD-10-CM | POA: Insufficient documentation

## 2021-09-02 DIAGNOSIS — O0993 Supervision of high risk pregnancy, unspecified, third trimester: Secondary | ICD-10-CM

## 2021-09-02 DIAGNOSIS — Z369 Encounter for antenatal screening, unspecified: Secondary | ICD-10-CM

## 2021-09-02 DIAGNOSIS — Z3A31 31 weeks gestation of pregnancy: Secondary | ICD-10-CM

## 2021-09-02 DIAGNOSIS — O09523 Supervision of elderly multigravida, third trimester: Secondary | ICD-10-CM

## 2021-09-02 DIAGNOSIS — O3663X0 Maternal care for excessive fetal growth, third trimester, not applicable or unspecified: Secondary | ICD-10-CM

## 2021-09-02 DIAGNOSIS — Z131 Encounter for screening for diabetes mellitus: Secondary | ICD-10-CM

## 2021-09-02 DIAGNOSIS — O403XX0 Polyhydramnios, third trimester, not applicable or unspecified: Secondary | ICD-10-CM

## 2021-09-02 DIAGNOSIS — O0990 Supervision of high risk pregnancy, unspecified, unspecified trimester: Secondary | ICD-10-CM

## 2021-09-02 DIAGNOSIS — Z98891 History of uterine scar from previous surgery: Secondary | ICD-10-CM

## 2021-09-02 HISTORY — DX: Maternal care for excessive fetal growth, unspecified trimester, not applicable or unspecified: O36.60X0

## 2021-09-02 LAB — POCT URINALYSIS DIPSTICK OB
Glucose, UA: NEGATIVE
POC,PROTEIN,UA: NEGATIVE

## 2021-09-02 NOTE — Patient Instructions (Signed)

## 2021-09-02 NOTE — Progress Notes (Signed)
Routine Prenatal Care Visit ? ?Subjective  ?Kimberly Holder is a 41 y.o. [id number] at [redacted]w[redacted]d being seen today for ongoing prenatal care.  She is currently monitored for the following issues for this high-risk pregnancy and has Preventative health care; Hirsutism; PCOS (polycystic ovarian syndrome); Atypical squamous cells of undetermined significance (ASCUS) on Papanicolaou smear of cervix; Overweight (BMI 25.0-29.9); History of melasma; Supervision of high risk pregnancy, antepartum; Advanced maternal age in multigravida; Hx of preeclampsia, prior pregnancy, currently pregnant; History of 2 cesarean sections; Vitamin D deficiency; Polyhydramnios affecting pregnancy in third trimester; and Large for gestational age fetus affecting management of mother on their problem list.  ?----------------------------------------------------------------------------------- ?Patient reports no complaints.  Reviewed findings of recent ultrasound- polyhydramnios/LGA and recommendations. 3 hr gtt today per MFM recommendation.  ?Contractions: Not present. Vag. Bleeding: None.  Movement: Present. Leaking Fluid denies.  ?----------------------------------------------------------------------------------- ?The following portions of the patient's history were reviewed and updated as appropriate: allergies, current medications, past family history, past medical history, past social history, past surgical history and problem list. Problem list updated. ? ?Objective  ?Blood pressure 120/80, weight 197 lb (89.4 kg), last menstrual period 01/28/2021. ?Pregravid weight 152 lb (68.9 kg) Total Weight Gain 45 lb (20.4 kg) ?Urinalysis: Urine Protein    Urine Glucose   ? ?Fetal Status: Fetal Heart Rate (bpm): 143 Fundal Height: 36 cm Movement: Present    ? ?General:  Alert, oriented and cooperative. Patient is in no acute distress.  ?Skin: Skin is warm and dry. No rash noted.   ?Cardiovascular: Normal heart rate noted  ?Respiratory: Normal respiratory  effort, no problems with respiration noted  ?Abdomen: Soft, gravid, appropriate for gestational age. Pain/Pressure: Absent     ?Pelvic:  Cervical exam deferred        ?Extremities: Normal range of motion.  Edema: None  ?Mental Status: Normal mood and affect. Normal behavior. Normal judgment and thought content.  ? ?Assessment  ? ?41 y.o. [id number] at [redacted]w[redacted]d by  11/04/2021, by Last Menstrual Period presenting for routine prenatal visit ? ?Plan  ? ?pregnancy 5 Problems (from 04/13/21 to present)   ? Problem Noted Resolved  ? Large for gestational age fetus affecting management of mother 09/02/2021 by Tresea Mall, CNM No  ? Polyhydramnios affecting pregnancy in third trimester 08/26/2021 by Ellwood Sayers, CNM No  ? Supervision of high risk pregnancy, antepartum 04/13/2021 by Tresea Mall, CNM No  ? Overview Addendum 08/04/2021  1:40 PM by Mirna Mires, CNM  ?   ?Nursing Staff Provider  ?Office Location  [facility] Dating  L=11  ?Language  English Anatomy US    ?Flu Vaccine   Genetic Screen  NIPS: nml XY  ?TDaP vaccine    Hgb A1C or  ?GTT Early : BMI 28 pre pregnancy.  ?A1C 4.9 ?Third trimester : 136  ?Covid    LAB RESULTS   ?Rhogam   Blood Type   A+  ?Feeding Plan Breast Antibody  negative  ?Contraception BTL Rubella  immune  ?Circumcision  RPR   NR  ?Pediatrician   HBsAg   negative  ?Support Person Husband Thayer Ohm HIV  negative  ?Prenatal Classes  Varicella immune  ?CS Sch 39 weeks GBS  (For PCN allergy, check sensitivities)   ?BTL Consent None needed, but desires  Hepatitis C negative  ?VBAC Consent No Pap  2022 LGSIL- repeat postpartum  ?  Hgb Electro    ?Pelvis Tested  CF   ?   SMA   ?     ? ?  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 ?  ?  ? Advanced maternal age in multigravida 04/13/2021 by Tresea Mall, CNM No  ? Hx of preeclampsia, prior pregnancy, currently pregnant 04/13/2021 by Tresea Mall, CNM  No  ? History of 2 cesarean sections 04/13/2021 by Tresea Mall, CNM No  ? Overview Signed 05/17/2021  4:48 PM by Nadara Mustard, MD  ?  OB/GYN  Counseling Note ? ?41 y.o. [phone] at [redacted]w[redacted]d with Estimated Date of Delivery: 11/04/21 was seen today in office to discuss trial of labor after cesarean section (TOLAC) versus elective repeat cesarean delivery (ERCD). The following risks were discussed with the patient. ? ?Risk of uterine rupture at term is 0.78 percent with TOLAC and 0.22 percent with ERCD. 1 in 10 uterine ruptures will result in neonatal death or neurological injury. ?The benefits of a trial of labor after cesarean (TOLAC) resulting in a vaginal birth after cesarean (VBAC) include the following: shorter length of hospital stay and postpartum recovery (in most cases); fewer complications, such as postpartum fever, wound or uterine infection, thromboembolism (blood clots in the leg or lung), need for blood transfusion and fewer neonatal breathing problems. ? ?The risks of an attempted VBAC or TOLAC include the following: ?Risk of failed trial of labor after cesarean (TOLAC) without a vaginal birth after cesarean (VBAC) resulting in repeat cesarean delivery (RCD) in about 20 to 40 percent of women who attempt VBAC.  ?Risk of rupture of uterus resulting in an emergency cesarean delivery. The risk of uterine rupture may be related in part to the type of uterine incision made during the first cesarean delivery. A previous transverse uterine incision has the lowest risk of rupture (0.2 to 1.5 percent risk). Vertical or T-shaped uterine incisions have a higher risk of uterine rupture (4 to 9 percent risk)The risk of fetal death is very low with both VBAC and elective repeat cesarean delivery (ERCD), but the likelihood of fetal death is higher with VBAC than with ERCD. Maternal death is very rare with either type of delivery. ? ?The risks of an elective repeat cesarean delivery (ERCD) were reviewed with the  patient including but not limited to: 08/998 risk of uterine rupture which could have serious consequences, bleeding which may require transfusion; infection which may require antibiotics; injury to bowel, bladder or other surrounding organs (bowel, bladder, ureters); injury to the fetus; need for additional procedures including hysterectomy in the event of a life-threatening hemorrhage; thromboembolic phenomenon; abnormal placentation; incisional problems; death and other postoperative or anesthesia complications.   ? ?Pt desires CS (w BTL) ?  ?  ?  ?NST/BPP weekly starting in 1 week per MFM  ? ?Preterm labor symptoms and general obstetric precautions including but not limited to vaginal bleeding, contractions, leaking of fluid and fetal movement were reviewed in detail with the patient. ?Please refer to After Visit Summary for other counseling recommendations.  ? ?Return in about 1 week (around 09/09/2021) for nst and rob. ? ?Tresea Mall, CNM ?09/02/2021 9:43 AM   ? ?

## 2021-09-02 NOTE — Addendum Note (Signed)
Addended by: Cornelius Moras D on: 09/02/2021 10:01 AM ? ? Modules accepted: Orders ? ?

## 2021-09-03 ENCOUNTER — Encounter: Payer: Self-pay | Admitting: Obstetrics & Gynecology

## 2021-09-03 ENCOUNTER — Encounter: Payer: Self-pay | Admitting: Advanced Practice Midwife

## 2021-09-03 ENCOUNTER — Encounter: Payer: Self-pay | Admitting: Licensed Practical Nurse

## 2021-09-03 LAB — GESTATIONAL GLUCOSE TOLERANCE
Glucose, Fasting: 65 mg/dL — ABNORMAL LOW (ref 70–94)
Glucose, GTT - 1 Hour: 166 mg/dL (ref 70–179)
Glucose, GTT - 2 Hour: 157 mg/dL — ABNORMAL HIGH (ref 70–154)
Glucose, GTT - 3 Hour: 122 mg/dL (ref 70–139)

## 2021-09-08 ENCOUNTER — Other Ambulatory Visit: Payer: Self-pay

## 2021-09-08 ENCOUNTER — Encounter: Payer: Self-pay | Admitting: Obstetrics and Gynecology

## 2021-09-08 ENCOUNTER — Ambulatory Visit (INDEPENDENT_AMBULATORY_CARE_PROVIDER_SITE_OTHER): Payer: 59 | Admitting: Obstetrics and Gynecology

## 2021-09-08 ENCOUNTER — Encounter: Payer: 59 | Admitting: Obstetrics

## 2021-09-08 VITALS — BP 120/60 | Wt 198.0 lb

## 2021-09-08 DIAGNOSIS — Z3A31 31 weeks gestation of pregnancy: Secondary | ICD-10-CM

## 2021-09-08 DIAGNOSIS — O0990 Supervision of high risk pregnancy, unspecified, unspecified trimester: Secondary | ICD-10-CM

## 2021-09-08 DIAGNOSIS — Z98891 History of uterine scar from previous surgery: Secondary | ICD-10-CM

## 2021-09-08 DIAGNOSIS — O09523 Supervision of elderly multigravida, third trimester: Secondary | ICD-10-CM | POA: Diagnosis not present

## 2021-09-08 DIAGNOSIS — O403XX0 Polyhydramnios, third trimester, not applicable or unspecified: Secondary | ICD-10-CM

## 2021-09-08 DIAGNOSIS — O09299 Supervision of pregnancy with other poor reproductive or obstetric history, unspecified trimester: Secondary | ICD-10-CM

## 2021-09-08 LAB — POCT URINALYSIS DIPSTICK OB
Glucose, UA: NEGATIVE
POC,PROTEIN,UA: NEGATIVE

## 2021-09-08 NOTE — Addendum Note (Signed)
Addended by: Burtis Junes on: 09/08/2021 10:13 AM ? ? Modules accepted: Orders ? ?

## 2021-09-08 NOTE — Progress Notes (Signed)
? ?  PRENATAL VISIT NOTE ? ?Subjective:  ?Kimberly Holder is a 41 y.o. [id number] at [redacted]w[redacted]d being seen today for ongoing prenatal care.  She is currently monitored for the following issues for this high-risk pregnancy and has Preventative health care; Hirsutism; PCOS (polycystic ovarian syndrome); Atypical squamous cells of undetermined significance (ASCUS) on Papanicolaou smear of cervix; Overweight (BMI 25.0-29.9); History of melasma; Supervision of high risk pregnancy, antepartum; Advanced maternal age in multigravida; Hx of preeclampsia, prior pregnancy, currently pregnant; History of 2 cesarean sections; Vitamin D deficiency; Polyhydramnios affecting pregnancy in third trimester; and Large for gestational age fetus affecting management of mother on their problem list. ? ?Patient reports no complaints.  Contractions: Not present. Vag. Bleeding: None.  Movement: Present. Denies leaking of fluid.  ? ?The following portions of the patient's history were reviewed and updated as appropriate: allergies, current medications, past family history, past medical history, past social history, past surgical history and problem list.  ? ?Objective:  ? ?Vitals:  ? 09/08/21 0819  ?BP: 120/60  ?Weight: 198 lb (89.8 kg)  ? ? ?Fetal Status: Fetal Heart Rate (bpm): 145 Fundal Height: 36 cm Movement: Present    ? ?General:  Alert, oriented and cooperative. Patient is in no acute distress.  ?Skin: Skin is warm and dry. No rash noted.   ?Cardiovascular: Normal heart rate noted  ?Respiratory: Normal respiratory effort, no problems with respiration noted  ?Abdomen: Soft, gravid, appropriate for gestational age.  Pain/Pressure: Absent     ?Pelvic: Cervical exam deferred        ?Extremities: Normal range of motion.  Edema: None  ?Mental Status: Normal mood and affect. Normal behavior. Normal judgment and thought content.  ? ?Assessment and Plan:  ?Pregnancy: [id number] at [redacted]w[redacted]d ?1. Supervision of high risk pregnancy, antepartum ?Patient is doing  well without complaints ? ?2. History of 2 cesarean sections ?Will be scheduled for repeat c-section with BTL at 39 weeks ? ?3. Polyhydramnios affecting pregnancy in third trimester ?Continue MFM scans as scheduled ?Continue antenatal testing weekly ?NST reviewed and reactive with baseline 145, mod variability, + accels, no decels. TOCO: no contractions ? ?4. Multigravida of advanced maternal age in third trimester ? ? ?5. Hx of preeclampsia, prior pregnancy, currently pregnant ?Asymptomatic  ? ? ?Preterm labor symptoms and general obstetric precautions including but not limited to vaginal bleeding, contractions, leaking of fluid and fetal movement were reviewed in detail with the patient. ?Please refer to After Visit Summary for other counseling recommendations.  ? ?Return in about 1 week (around 09/15/2021) for in person, ROB. ? ?Future Appointments  ?Date Time Provider [place]  ?09/10/2021 10:45 AM [facility] NURSE [facility]  ?09/10/2021 11:00 AM [facility] US1 [facility]  ?09/13/2021  8:15 AM Imagene Riches, CNM [facility] None  ?09/17/2021  8:15 AM [facility] NURSE [facility]  ?09/17/2021  8:30 AM [facility] US3 [facility]  ?09/24/2021  7:30 AM [facility] NURSE [facility]  ?09/24/2021  7:45 AM [facility] US4 [facility]  ?04/27/2022  8:40 AM McLean-Scocuzza, Nino Glow, MD [facility]  ? ? ?Mora Bellman, MD ? ?

## 2021-09-10 ENCOUNTER — Ambulatory Visit: Payer: 59 | Attending: Obstetrics and Gynecology

## 2021-09-10 ENCOUNTER — Other Ambulatory Visit: Payer: Self-pay

## 2021-09-10 ENCOUNTER — Ambulatory Visit: Payer: 59 | Admitting: *Deleted

## 2021-09-10 ENCOUNTER — Encounter: Payer: Self-pay | Admitting: *Deleted

## 2021-09-10 VITALS — BP 132/65 | HR 84

## 2021-09-10 DIAGNOSIS — O0990 Supervision of high risk pregnancy, unspecified, unspecified trimester: Secondary | ICD-10-CM

## 2021-09-10 DIAGNOSIS — O09523 Supervision of elderly multigravida, third trimester: Secondary | ICD-10-CM | POA: Diagnosis present

## 2021-09-10 DIAGNOSIS — O3663X0 Maternal care for excessive fetal growth, third trimester, not applicable or unspecified: Secondary | ICD-10-CM

## 2021-09-10 DIAGNOSIS — O09513 Supervision of elderly primigravida, third trimester: Secondary | ICD-10-CM | POA: Insufficient documentation

## 2021-09-10 DIAGNOSIS — O3663X1 Maternal care for excessive fetal growth, third trimester, fetus 1: Secondary | ICD-10-CM | POA: Diagnosis not present

## 2021-09-10 DIAGNOSIS — Z3A32 32 weeks gestation of pregnancy: Secondary | ICD-10-CM

## 2021-09-10 DIAGNOSIS — O35EXX0 Maternal care for other (suspected) fetal abnormality and damage, fetal genitourinary anomalies, not applicable or unspecified: Secondary | ICD-10-CM

## 2021-09-10 DIAGNOSIS — O34219 Maternal care for unspecified type scar from previous cesarean delivery: Secondary | ICD-10-CM | POA: Diagnosis not present

## 2021-09-10 DIAGNOSIS — O403XX0 Polyhydramnios, third trimester, not applicable or unspecified: Secondary | ICD-10-CM | POA: Diagnosis present

## 2021-09-10 DIAGNOSIS — O09299 Supervision of pregnancy with other poor reproductive or obstetric history, unspecified trimester: Secondary | ICD-10-CM

## 2021-09-10 DIAGNOSIS — Z98891 History of uterine scar from previous surgery: Secondary | ICD-10-CM

## 2021-09-10 DIAGNOSIS — E669 Obesity, unspecified: Secondary | ICD-10-CM

## 2021-09-10 DIAGNOSIS — O99213 Obesity complicating pregnancy, third trimester: Secondary | ICD-10-CM | POA: Insufficient documentation

## 2021-09-13 ENCOUNTER — Ambulatory Visit (INDEPENDENT_AMBULATORY_CARE_PROVIDER_SITE_OTHER): Payer: 59 | Admitting: Obstetrics

## 2021-09-13 ENCOUNTER — Other Ambulatory Visit: Payer: Self-pay

## 2021-09-13 VITALS — BP 128/74 | Wt 199.0 lb

## 2021-09-13 DIAGNOSIS — O403XX0 Polyhydramnios, third trimester, not applicable or unspecified: Secondary | ICD-10-CM

## 2021-09-13 DIAGNOSIS — O0990 Supervision of high risk pregnancy, unspecified, unspecified trimester: Secondary | ICD-10-CM

## 2021-09-13 DIAGNOSIS — Z3A32 32 weeks gestation of pregnancy: Secondary | ICD-10-CM

## 2021-09-13 NOTE — Progress Notes (Signed)
NST today. No vb. No lof.  °

## 2021-09-13 NOTE — Progress Notes (Signed)
Routine Prenatal Care Visit ? ?Subjective  ?Kimberly Holder is a 41 y.o. [id number] at [redacted]w[redacted]d being seen today for ongoing prenatal care.  She is currently monitored for the following issues for this high-risk pregnancy and has Preventative health care; Hirsutism; PCOS (polycystic ovarian syndrome); Atypical squamous cells of undetermined significance (ASCUS) on Papanicolaou smear of cervix; Overweight (BMI 25.0-29.9); History of melasma; Supervision of high risk pregnancy, antepartum; Advanced maternal age in multigravida; Hx of preeclampsia, prior pregnancy, currently pregnant; History of 2 cesarean sections; Vitamin D deficiency; Polyhydramnios affecting pregnancy in third trimester; and Large for gestational age fetus affecting management of mother on their problem list.  ?----------------------------------------------------------------------------------- ?Patient reports no complaints.  Her son is quite active. She denies any headaches, visual changes or decreased fetal movement. Weekly NSTS and BPPs are scheduled. ?Contractions: Not present. Vag. Bleeding: None.  Movement: Present. Leaking Fluid denies.  ?----------------------------------------------------------------------------------- ?The following portions of the patient's history were reviewed and updated as appropriate: allergies, current medications, past family history, past medical history, past social history, past surgical history and problem list. Problem list updated. ? ?Objective  ?Blood pressure 128/74, weight 199 lb (90.3 kg), last menstrual period 01/28/2021. ?Pregravid weight 152 lb (68.9 kg) Total Weight Gain 47 lb (21.3 kg) ?Urinalysis: Urine Protein    Urine Glucose   ? ?Fetal Status:     Movement: Present    ? ?General:  Alert, oriented and cooperative. Patient is in no acute distress.  ?Skin: Skin is warm and dry. No rash noted.   ?Cardiovascular: Normal heart rate noted  ?Respiratory: Normal respiratory effort, no problems with respiration  noted  ?Abdomen: Soft, gravid, appropriate for gestational age. Pain/Pressure: Absent     ?Pelvic:  Cervical exam deferred        ?Extremities: Normal range of motion.     ?Mental Status: Normal mood and affect. Normal behavior. Normal judgment and thought content.  ? ?Assessment  ? ?41 y.o. [id number] at [redacted]w[redacted]d by  11/04/2021, by Last Menstrual Period presenting for routine prenatal visit ? ?Plan  ? ?October 2022 Problems (from 04/13/21 to present)   ? Problem Noted Resolved  ? Large for gestational age fetus affecting management of mother 09/02/2021 by Rod Can, CNM No  ? Polyhydramnios affecting pregnancy in third trimester 08/26/2021 by Allen Derry, CNM No  ? Supervision of high risk pregnancy, antepartum 04/13/2021 by Rod Can, CNM No  ? Overview Addendum 08/04/2021  1:40 PM by Imagene Riches, CNM  ?   ?Nursing Staff Provider  ?Office Location  [facility] Dating  L=11  ?Language  English Anatomy US    ?Flu Vaccine   Genetic Screen  NIPS: nml XY  ?TDaP vaccine    Hgb A1C or  ?GTT Early : BMI 28 pre pregnancy.  ?A1C 4.9 ?Third trimester : 136  ?Covid    LAB RESULTS   ?Rhogam   Blood Type   A+  ?Feeding Plan Breast Antibody  negative  ?Contraception BTL Rubella  immune  ?Circumcision  RPR   NR  ?Pediatrician   HBsAg   negative  ?Support Person [name] HIV  negative  ?Prenatal Classes  Varicella immune  ?CS Sch 39 weeks GBS  (For PCN allergy, check sensitivities)   ?BTL Consent None needed, but desires  Hepatitis C negative  ?VBAC Consent No Pap  2022 LGSIL- repeat postpartum  ?  Hgb Electro    ?Pelvis Tested  CF   ?   SMA   ?     ? ?  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 ?  ?  ? Advanced maternal age in multigravida 04/13/2021 by Rod Can, CNM No  ? Hx of preeclampsia, prior pregnancy, currently pregnant 04/13/2021 by Rod Can, CNM No  ? History of 2 cesarean sections 04/13/2021 by Rod Can, CNM No  ? Overview  Signed 05/17/2021  4:48 PM by Gae Dry, MD  ?  OB/GYN  Counseling Note ? ?41 y.o. [phone] at [redacted]w[redacted]d with Estimated Date of Delivery: 11/04/21 was seen today in office to discuss trial of labor after cesarean section (TOLAC) versus elective repeat cesarean delivery (ERCD). The following risks were discussed with the patient. ? ?Risk of uterine rupture at term is 0.78 percent with TOLAC and 0.22 percent with ERCD. 1 in 10 uterine ruptures will result in neonatal death or neurological injury. ?The benefits of a trial of labor after cesarean (TOLAC) resulting in a vaginal birth after cesarean (VBAC) include the following: shorter length of hospital stay and postpartum recovery (in most cases); fewer complications, such as postpartum fever, wound or uterine infection, thromboembolism (blood clots in the leg or lung), need for blood transfusion and fewer neonatal breathing problems. ? ?The risks of an attempted VBAC or TOLAC include the following: ?Risk of failed trial of labor after cesarean (TOLAC) without a vaginal birth after cesarean (VBAC) resulting in repeat cesarean delivery (RCD) in about 20 to 73 percent of women who attempt VBAC.  ?Risk of rupture of uterus resulting in an emergency cesarean delivery. The risk of uterine rupture may be related in part to the type of uterine incision made during the first cesarean delivery. A previous transverse uterine incision has the lowest risk of rupture (0.2 to 1.5 percent risk). Vertical or T-shaped uterine incisions have a higher risk of uterine rupture (4 to 9 percent risk)The risk of fetal death is very low with both VBAC and elective repeat cesarean delivery (ERCD), but the likelihood of fetal death is higher with VBAC than with ERCD. Maternal death is very rare with either type of delivery. ? ?The risks of an elective repeat cesarean delivery (ERCD) were reviewed with the patient including but not limited to: 08/998 risk of uterine rupture which could have  serious consequences, bleeding which may require transfusion; infection which may require antibiotics; injury to bowel, bladder or other surrounding organs (bowel, bladder, ureters); injury to the fetus; need for additional procedures including hysterectomy in the event of a life-threatening hemorrhage; thromboembolic phenomenon; abnormal placentation; incisional problems; death and other postoperative or anesthesia complications.   ? ?Pt desires CS (w BTL) ?  ?  ?  ?  ? ?Preterm labor symptoms and general obstetric precautions including but not limited to vaginal bleeding, contractions, leaking of fluid and fetal movement were reviewed in detail with the patient. ?Please refer to After Visit Summary for other counseling recommendations.  ? ?Return in about 1 week (around 09/20/2021) for return OB, NST. ? ?Imagene Riches, CNM  ?09/13/2021 8:34 AM   ? ?

## 2021-09-17 ENCOUNTER — Ambulatory Visit: Payer: 59 | Attending: Obstetrics and Gynecology

## 2021-09-17 ENCOUNTER — Ambulatory Visit: Payer: 59 | Admitting: *Deleted

## 2021-09-17 ENCOUNTER — Other Ambulatory Visit: Payer: Self-pay

## 2021-09-17 ENCOUNTER — Other Ambulatory Visit: Payer: Self-pay | Admitting: *Deleted

## 2021-09-17 ENCOUNTER — Encounter: Payer: Self-pay | Admitting: *Deleted

## 2021-09-17 ENCOUNTER — Telehealth: Payer: Self-pay

## 2021-09-17 VITALS — BP 125/66 | HR 90

## 2021-09-17 DIAGNOSIS — O35EXX0 Maternal care for other (suspected) fetal abnormality and damage, fetal genitourinary anomalies, not applicable or unspecified: Secondary | ICD-10-CM | POA: Diagnosis not present

## 2021-09-17 DIAGNOSIS — O09523 Supervision of elderly multigravida, third trimester: Secondary | ICD-10-CM

## 2021-09-17 DIAGNOSIS — O403XX0 Polyhydramnios, third trimester, not applicable or unspecified: Secondary | ICD-10-CM | POA: Insufficient documentation

## 2021-09-17 DIAGNOSIS — E669 Obesity, unspecified: Secondary | ICD-10-CM

## 2021-09-17 DIAGNOSIS — O34219 Maternal care for unspecified type scar from previous cesarean delivery: Secondary | ICD-10-CM | POA: Insufficient documentation

## 2021-09-17 DIAGNOSIS — O3663X1 Maternal care for excessive fetal growth, third trimester, fetus 1: Secondary | ICD-10-CM | POA: Insufficient documentation

## 2021-09-17 DIAGNOSIS — O0990 Supervision of high risk pregnancy, unspecified, unspecified trimester: Secondary | ICD-10-CM | POA: Insufficient documentation

## 2021-09-17 DIAGNOSIS — O09299 Supervision of pregnancy with other poor reproductive or obstetric history, unspecified trimester: Secondary | ICD-10-CM | POA: Diagnosis present

## 2021-09-17 DIAGNOSIS — O3663X0 Maternal care for excessive fetal growth, third trimester, not applicable or unspecified: Secondary | ICD-10-CM

## 2021-09-17 DIAGNOSIS — O99213 Obesity complicating pregnancy, third trimester: Secondary | ICD-10-CM | POA: Diagnosis present

## 2021-09-17 DIAGNOSIS — Z98891 History of uterine scar from previous surgery: Secondary | ICD-10-CM | POA: Insufficient documentation

## 2021-09-17 DIAGNOSIS — O09513 Supervision of elderly primigravida, third trimester: Secondary | ICD-10-CM | POA: Diagnosis not present

## 2021-09-17 DIAGNOSIS — O409XX0 Polyhydramnios, unspecified trimester, not applicable or unspecified: Secondary | ICD-10-CM

## 2021-09-17 DIAGNOSIS — Z3A33 33 weeks gestation of pregnancy: Secondary | ICD-10-CM

## 2021-09-17 NOTE — Telephone Encounter (Signed)
Left message of appointment scheduled for Friday 10/01/21 - will need to arrive at 8:30am. ?Asked patient to call if this will not work for her. ?

## 2021-09-21 ENCOUNTER — Ambulatory Visit (INDEPENDENT_AMBULATORY_CARE_PROVIDER_SITE_OTHER): Payer: 59 | Admitting: Obstetrics and Gynecology

## 2021-09-21 ENCOUNTER — Encounter: Payer: Self-pay | Admitting: Obstetrics and Gynecology

## 2021-09-21 ENCOUNTER — Other Ambulatory Visit: Payer: Self-pay

## 2021-09-21 VITALS — BP 120/62 | Wt 200.0 lb

## 2021-09-21 DIAGNOSIS — O09299 Supervision of pregnancy with other poor reproductive or obstetric history, unspecified trimester: Secondary | ICD-10-CM

## 2021-09-21 DIAGNOSIS — Z3A33 33 weeks gestation of pregnancy: Secondary | ICD-10-CM

## 2021-09-21 DIAGNOSIS — O403XX0 Polyhydramnios, third trimester, not applicable or unspecified: Secondary | ICD-10-CM

## 2021-09-21 DIAGNOSIS — O0990 Supervision of high risk pregnancy, unspecified, unspecified trimester: Secondary | ICD-10-CM | POA: Diagnosis not present

## 2021-09-21 DIAGNOSIS — O09523 Supervision of elderly multigravida, third trimester: Secondary | ICD-10-CM

## 2021-09-21 DIAGNOSIS — Z98891 History of uterine scar from previous surgery: Secondary | ICD-10-CM

## 2021-09-21 LAB — POCT URINALYSIS DIPSTICK OB
Glucose, UA: NEGATIVE
POC,PROTEIN,UA: NEGATIVE

## 2021-09-21 LAB — FETAL NONSTRESS TEST

## 2021-09-21 NOTE — Progress Notes (Signed)
? ?  PRENATAL VISIT NOTE ? ?Subjective:  ?Kimberly Holder is a 41 y.o. [id number] at [redacted]w[redacted]d being seen today for ongoing prenatal care.  She is currently monitored for the following issues for this high-risk pregnancy and has Preventative health care; Hirsutism; PCOS (polycystic ovarian syndrome); Atypical squamous cells of undetermined significance (ASCUS) on Papanicolaou smear of cervix; Overweight (BMI 25.0-29.9); History of melasma; Supervision of high risk pregnancy, antepartum; Advanced maternal age in multigravida; Hx of preeclampsia, prior pregnancy, currently pregnant; History of 2 cesarean sections; Vitamin D deficiency; Polyhydramnios affecting pregnancy in third trimester; and Large for gestational age fetus affecting management of mother on their problem list. ? ?Patient reports no complaints.  Contractions: Not present. Vag. Bleeding: None.  Movement: Present. Denies leaking of fluid.  ? ?The following portions of the patient's history were reviewed and updated as appropriate: allergies, current medications, past family history, past medical history, past social history, past surgical history and problem list.  ? ?Objective:  ? ?Vitals:  ? 09/21/21 0831  ?BP: 120/62  ?Weight: 200 lb (90.7 kg)  ? ? ?Fetal Status:     Movement: Present    ? ?General:  Alert, oriented and cooperative. Patient is in no acute distress.  ?Skin: Skin is warm and dry. No rash noted.   ?Cardiovascular: Normal heart rate noted  ?Respiratory: Normal respiratory effort, no problems with respiration noted  ?Abdomen: Soft, gravid, appropriate for gestational age.  Pain/Pressure: Absent     ?Pelvic: Cervical exam deferred        ?Extremities: Normal range of motion.     ?Mental Status: Normal mood and affect. Normal behavior. Normal judgment and thought content.  ? ?Assessment and Plan:  ?Pregnancy: [id number] at [redacted]w[redacted]d ?1. Supervision of high risk pregnancy, antepartum ?Patient is doing well without complaints ? ?2. Polyhydramnios affecting  pregnancy in third trimester ?Continue weekly BPP ?NST reviewed and reactive with baseline 140, mod variability, +accels, no decels ? ?3. History of 2 cesarean sections ?Scheduled for repeat with BTL on 4/28 ? ?4. Hx of preeclampsia, prior pregnancy, currently pregnant ?Normotensive and asymptomatic ?Continue ASA ? ?5. Multigravida of advanced maternal age in third trimester ? ? ?Preterm labor symptoms and general obstetric precautions including but not limited to vaginal bleeding, contractions, leaking of fluid and fetal movement were reviewed in detail with the patient. ?Please refer to After Visit Summary for other counseling recommendations.  ? ?Return in about 1 week (around 09/28/2021) for in person, ROB, High risk, NST. ? ?Future Appointments  ?Date Time Provider [place]  ?09/24/2021  7:30 AM [facility] NURSE [facility]  ?09/24/2021  7:45 AM [facility] US4 [facility]  ?09/28/2021  8:35 AM Gracemarie Skeet, Vickii Chafe, MD [facility] None  ?10/01/2021  8:30 AM [facility] NURSE [facility]  ?10/01/2021  8:45 AM [facility] US7 [facility]  ?10/05/2021  7:30 AM [facility] NURSE [facility]  ?10/05/2021  7:45 AM [facility] US5 [facility]  ?10/11/2021  8:55 AM Dominic, Nunzio Cobbs, CNM [facility] None  ?10/12/2021  7:30 AM [facility] NURSE [facility]  ?10/12/2021  7:45 AM [facility] US5 [facility]  ?10/13/2021  8:35 AM Biff Rutigliano, Vickii Chafe, MD [facility] None  ?10/18/2021  9:00 AM [facility] PAT1 [facility] None  ?10/25/2021  8:55 AM Imagene Riches, CNM [facility] None  ?10/27/2021  8:45 AM [facility] PAT4 [facility] None  ?04/27/2022  8:40 AM McLean-Scocuzza, Nino Glow, MD [facility]  ? ? ?Mora Bellman, MD ? ?

## 2021-09-21 NOTE — Progress Notes (Signed)
c 

## 2021-09-24 ENCOUNTER — Ambulatory Visit: Payer: 59 | Admitting: *Deleted

## 2021-09-24 ENCOUNTER — Encounter: Payer: Self-pay | Admitting: *Deleted

## 2021-09-24 ENCOUNTER — Other Ambulatory Visit: Payer: Self-pay

## 2021-09-24 ENCOUNTER — Ambulatory Visit: Payer: 59 | Attending: Obstetrics and Gynecology

## 2021-09-24 VITALS — BP 149/70 | HR 103

## 2021-09-24 DIAGNOSIS — O403XX0 Polyhydramnios, third trimester, not applicable or unspecified: Secondary | ICD-10-CM

## 2021-09-24 DIAGNOSIS — O09299 Supervision of pregnancy with other poor reproductive or obstetric history, unspecified trimester: Secondary | ICD-10-CM | POA: Diagnosis present

## 2021-09-24 DIAGNOSIS — Z98891 History of uterine scar from previous surgery: Secondary | ICD-10-CM

## 2021-09-24 DIAGNOSIS — O0990 Supervision of high risk pregnancy, unspecified, unspecified trimester: Secondary | ICD-10-CM | POA: Insufficient documentation

## 2021-09-24 DIAGNOSIS — O99213 Obesity complicating pregnancy, third trimester: Secondary | ICD-10-CM | POA: Diagnosis present

## 2021-09-24 DIAGNOSIS — Z3A34 34 weeks gestation of pregnancy: Secondary | ICD-10-CM

## 2021-09-24 DIAGNOSIS — O34219 Maternal care for unspecified type scar from previous cesarean delivery: Secondary | ICD-10-CM | POA: Diagnosis not present

## 2021-09-24 DIAGNOSIS — O09293 Supervision of pregnancy with other poor reproductive or obstetric history, third trimester: Secondary | ICD-10-CM

## 2021-09-24 DIAGNOSIS — O09523 Supervision of elderly multigravida, third trimester: Secondary | ICD-10-CM | POA: Diagnosis present

## 2021-09-24 DIAGNOSIS — O09513 Supervision of elderly primigravida, third trimester: Secondary | ICD-10-CM | POA: Insufficient documentation

## 2021-09-24 DIAGNOSIS — O3663X0 Maternal care for excessive fetal growth, third trimester, not applicable or unspecified: Secondary | ICD-10-CM | POA: Insufficient documentation

## 2021-09-24 DIAGNOSIS — O3663X1 Maternal care for excessive fetal growth, third trimester, fetus 1: Secondary | ICD-10-CM | POA: Diagnosis present

## 2021-09-24 DIAGNOSIS — E669 Obesity, unspecified: Secondary | ICD-10-CM

## 2021-09-27 ENCOUNTER — Telehealth: Payer: Self-pay

## 2021-09-27 NOTE — Telephone Encounter (Signed)
Pt called after hour nurse 09/24/21 5:01pm; 34wks; took benadryl but it did not let her sleep; would like to know if she can take/do something else; sxs congestion and runny nose.  Was adv to do nasal washes, zyrtec; call back if sxs aren't better in 2 days with continuous antihistamines or gets worse.  [phone] Pt states she is feeling better today than she did Friday; states it is breaking up and the drainage is making her cough; adv Hall's cough drops, plain robutissin/plain musinex; drink hot beverages but watch caffeine intake (only allowed 16oz/d including chocolate) and no red raspberry tea. ?

## 2021-09-28 ENCOUNTER — Other Ambulatory Visit: Payer: Self-pay

## 2021-09-28 ENCOUNTER — Ambulatory Visit (INDEPENDENT_AMBULATORY_CARE_PROVIDER_SITE_OTHER): Payer: 59 | Admitting: Obstetrics and Gynecology

## 2021-09-28 VITALS — BP 128/72 | Wt 200.0 lb

## 2021-09-28 DIAGNOSIS — O3663X0 Maternal care for excessive fetal growth, third trimester, not applicable or unspecified: Secondary | ICD-10-CM

## 2021-09-28 DIAGNOSIS — Z98891 History of uterine scar from previous surgery: Secondary | ICD-10-CM

## 2021-09-28 DIAGNOSIS — O403XX0 Polyhydramnios, third trimester, not applicable or unspecified: Secondary | ICD-10-CM

## 2021-09-28 DIAGNOSIS — O09299 Supervision of pregnancy with other poor reproductive or obstetric history, unspecified trimester: Secondary | ICD-10-CM

## 2021-09-28 DIAGNOSIS — Z3A34 34 weeks gestation of pregnancy: Secondary | ICD-10-CM

## 2021-09-28 DIAGNOSIS — O09523 Supervision of elderly multigravida, third trimester: Secondary | ICD-10-CM | POA: Diagnosis not present

## 2021-09-28 DIAGNOSIS — O0990 Supervision of high risk pregnancy, unspecified, unspecified trimester: Secondary | ICD-10-CM | POA: Diagnosis not present

## 2021-09-28 LAB — POCT URINALYSIS DIPSTICK OB
Glucose, UA: NEGATIVE
POC,PROTEIN,UA: NEGATIVE

## 2021-09-28 LAB — FETAL NONSTRESS TEST

## 2021-09-28 NOTE — Progress Notes (Signed)
? ?PRENATAL VISIT NOTE ? ?Subjective:  ?Kimberly Holder is a 41 y.o. [id number] at [redacted]w[redacted]d being seen today for ongoing prenatal care.  She is currently monitored for the following issues for this high-risk pregnancy and has Preventative health care; Hirsutism; PCOS (polycystic ovarian syndrome); Atypical squamous cells of undetermined significance (ASCUS) on Papanicolaou smear of cervix; Overweight (BMI 25.0-29.9); History of melasma; Supervision of high risk pregnancy, antepartum; Advanced maternal age in multigravida; Hx of preeclampsia, prior pregnancy, currently pregnant; History of 2 cesarean sections; Vitamin D deficiency; Polyhydramnios affecting pregnancy in third trimester; and Large for gestational age fetus affecting management of mother on their problem list. ? ?Patient reports no complaints.  Contractions: Not present. Vag. Bleeding: None.  Movement: Present. Denies leaking of fluid.  ? ?The following portions of the patient's history were reviewed and updated as appropriate: allergies, current medications, past family history, past medical history, past social history, past surgical history and problem list.  ? ?Objective:  ? ?Vitals:  ? 09/28/21 0832  ?BP: 128/72  ?Weight: 200 lb (90.7 kg)  ? ? ?Fetal Status:     Movement: Present    ? ?General:  Alert, oriented and cooperative. Patient is in no acute distress.  ?Skin: Skin is warm and dry. No rash noted.   ?Cardiovascular: Normal heart rate noted  ?Respiratory: Normal respiratory effort, no problems with respiration noted  ?Abdomen: Soft, gravid, appropriate for gestational age.  Pain/Pressure: Present     ?Pelvic: Cervical exam deferred        ?Extremities: Normal range of motion.     ?Mental Status: Normal mood and affect. Normal behavior. Normal judgment and thought content.  ? ?Assessment and Plan:  ?Pregnancy: [id number] at [redacted]w[redacted]d ?1. Supervision of high risk pregnancy, antepartum ?Patient is doing well without complaints ?Allergy symptoms  significantly improved ? ?2. Polyhydramnios affecting pregnancy in third trimester ?Continue weekly antenatal testing ?NST today reviewed and reactive with a baseline 145, mod variability, + accels, no decels- very active fetus ? ?3. Excessive fetal growth affecting management of pregnancy in third trimester, single or unspecified fetus ?CBG 3 hours post meal this morning was 39 (likely erroneus as patient is asymptomatic) ?A1c ordered ? ?4. History of 2 cesarean sections ?Scheduled for repeat on 4/28 ? ?5. Multigravida of advanced maternal age in third trimester ?Continue ASA ? ?6. Hx of preeclampsia, prior pregnancy, currently pregnant ?One elevated BP on 09/24/21, asymptomatic ?Continue to monitor closely ? ?7. [redacted] weeks gestation of pregnancy ? ? ?Preterm labor symptoms and general obstetric precautions including but not limited to vaginal bleeding, contractions, leaking of fluid and fetal movement were reviewed in detail with the patient. ?Please refer to After Visit Summary for other counseling recommendations.  ? ?No follow-ups on file. ? ?Future Appointments  ?Date Time Provider [place]  ?10/01/2021  8:30 AM [facility] NURSE [facility]  ?10/01/2021  8:45 AM [facility] US7 [facility]  ?10/04/2021  8:55 AM Dominic, Nunzio Cobbs, CNM [facility] None  ?10/05/2021  7:30 AM [facility] NURSE [facility]  ?10/05/2021  7:45 AM [facility] US5 [facility]  ?10/12/2021  7:30 AM [facility] NURSE [facility]  ?10/12/2021  7:45 AM [facility] US5 [facility]  ?10/13/2021  8:35 AM Faithe Ariola, Vickii Chafe, MD [facility] None  ?10/18/2021  9:00 AM [facility] PAT1 [facility] None  ?10/22/2021  8:55 AM Imagene Riches, CNM [facility] None  ?10/26/2021  8:55 AM Imagene Riches, CNM [facility] None  ?10/27/2021  8:45 AM [facility] PAT4 [facility] None  ?04/27/2022  8:40 AM  McLean-Scocuzza, Nino Glow, MD [facility]  ? ? ?Mora Bellman, MD ? ?

## 2021-09-29 ENCOUNTER — Encounter: Payer: Self-pay | Admitting: Obstetrics and Gynecology

## 2021-09-29 LAB — HEMOGLOBIN A1C
Est. average glucose Bld gHb Est-mCnc: 97 mg/dL
Hgb A1c MFr Bld: 5 % (ref 4.8–5.6)

## 2021-10-01 ENCOUNTER — Other Ambulatory Visit: Payer: Self-pay | Admitting: *Deleted

## 2021-10-01 ENCOUNTER — Ambulatory Visit: Payer: 59 | Admitting: *Deleted

## 2021-10-01 ENCOUNTER — Encounter: Payer: Self-pay | Admitting: *Deleted

## 2021-10-01 ENCOUNTER — Ambulatory Visit: Payer: 59 | Attending: Obstetrics and Gynecology

## 2021-10-01 VITALS — BP 118/69 | HR 83

## 2021-10-01 DIAGNOSIS — O09523 Supervision of elderly multigravida, third trimester: Secondary | ICD-10-CM

## 2021-10-01 DIAGNOSIS — O403XX0 Polyhydramnios, third trimester, not applicable or unspecified: Secondary | ICD-10-CM

## 2021-10-01 DIAGNOSIS — O09293 Supervision of pregnancy with other poor reproductive or obstetric history, third trimester: Secondary | ICD-10-CM | POA: Diagnosis not present

## 2021-10-01 DIAGNOSIS — O3663X0 Maternal care for excessive fetal growth, third trimester, not applicable or unspecified: Secondary | ICD-10-CM

## 2021-10-01 DIAGNOSIS — E669 Obesity, unspecified: Secondary | ICD-10-CM

## 2021-10-01 DIAGNOSIS — O09299 Supervision of pregnancy with other poor reproductive or obstetric history, unspecified trimester: Secondary | ICD-10-CM | POA: Diagnosis present

## 2021-10-01 DIAGNOSIS — O0990 Supervision of high risk pregnancy, unspecified, unspecified trimester: Secondary | ICD-10-CM | POA: Diagnosis present

## 2021-10-01 DIAGNOSIS — Z3A35 35 weeks gestation of pregnancy: Secondary | ICD-10-CM

## 2021-10-01 DIAGNOSIS — O409XX0 Polyhydramnios, unspecified trimester, not applicable or unspecified: Secondary | ICD-10-CM

## 2021-10-01 DIAGNOSIS — O34219 Maternal care for unspecified type scar from previous cesarean delivery: Secondary | ICD-10-CM

## 2021-10-01 DIAGNOSIS — O26843 Uterine size-date discrepancy, third trimester: Secondary | ICD-10-CM

## 2021-10-01 DIAGNOSIS — Z98891 History of uterine scar from previous surgery: Secondary | ICD-10-CM

## 2021-10-01 DIAGNOSIS — O99213 Obesity complicating pregnancy, third trimester: Secondary | ICD-10-CM

## 2021-10-01 DIAGNOSIS — Z6835 Body mass index (BMI) 35.0-35.9, adult: Secondary | ICD-10-CM

## 2021-10-04 ENCOUNTER — Encounter: Payer: Self-pay | Admitting: Licensed Practical Nurse

## 2021-10-04 ENCOUNTER — Ambulatory Visit (INDEPENDENT_AMBULATORY_CARE_PROVIDER_SITE_OTHER): Payer: 59 | Admitting: Licensed Practical Nurse

## 2021-10-04 VITALS — BP 122/62 | Wt 204.0 lb

## 2021-10-04 DIAGNOSIS — Z3A35 35 weeks gestation of pregnancy: Secondary | ICD-10-CM

## 2021-10-04 DIAGNOSIS — O0990 Supervision of high risk pregnancy, unspecified, unspecified trimester: Secondary | ICD-10-CM

## 2021-10-04 LAB — POCT URINALYSIS DIPSTICK OB
Glucose, UA: NEGATIVE
POC,PROTEIN,UA: NEGATIVE

## 2021-10-04 LAB — FETAL NONSTRESS TEST

## 2021-10-04 NOTE — Progress Notes (Signed)
Routine Prenatal Care Visit ? ?Subjective  ?Kimberly Holder is a 41 y.o. [id number] at [redacted]w[redacted]d being seen today for ongoing prenatal care.  She is currently monitored for the following issues for this high-risk pregnancy and has Preventative health care; Hirsutism; PCOS (polycystic ovarian syndrome); Atypical squamous cells of undetermined significance (ASCUS) on Papanicolaou smear of cervix; Overweight (BMI 25.0-29.9); History of melasma; Supervision of high risk pregnancy, antepartum; Advanced maternal age in multigravida; Hx of preeclampsia, prior pregnancy, currently pregnant; History of 2 cesarean sections; Vitamin D deficiency; Polyhydramnios affecting pregnancy in third trimester; and Large for gestational age fetus affecting management of mother on their problem list.  ?----------------------------------------------------------------------------------- ?Patient reports no complaints.  Dong well.  Sleep disturbed but getting sleep.  Mood is good.  ?Contractions: Not present. Vag. Bleeding: None.  Movement: Present. Leaking Fluid denies.  ?----------------------------------------------------------------------------------- ?The following portions of the patient's history were reviewed and updated as appropriate: allergies, current medications, past family history, past medical history, past social history, past surgical history and problem list. Problem list updated. ? ?Objective  ?Blood pressure 122/62, weight 204 lb (92.5 kg), last menstrual period 01/28/2021. ?Pregravid weight 152 lb (68.9 kg) Total Weight Gain 52 lb (23.6 kg) ?Urinalysis: Urine Protein Negative  Urine Glucose Negative ? ?Fetal Status: Fetal Heart Rate (bpm): NST   Movement: Present    ?NST: ?Baseline 135 moderate variability, pos accel (15x15) neg decel  ? ?General:  Alert, oriented and cooperative. Patient is in no acute distress.  ?Skin: Skin is warm and dry. No rash noted.   ?Cardiovascular: Normal heart rate noted  ?Respiratory: Normal  respiratory effort, no problems with respiration noted  ?Abdomen: Soft, gravid, appropriate for gestational age. Pain/Pressure: Absent     ?Pelvic:  Cervical exam deferred        ?Extremities: Normal range of motion.     ?Mental Status: Normal mood and affect. Normal behavior. Normal judgment and thought content.  ? ?Assessment  ? ?41 y.o. [id number] at [redacted]w[redacted]d by  11/04/2021, by Last Menstrual Period presenting for routine prenatal visit ?Reactive nst ? ?Plan  ? ?October 2022 Problems (from 04/13/21 to present)   ? ? Problem Noted Resolved  ? Large for gestational age fetus affecting management of mother 09/02/2021 by Rod Can, CNM No  ? Polyhydramnios affecting pregnancy in third trimester 08/26/2021 by Allen Derry, CNM No  ? Supervision of high risk pregnancy, antepartum 04/13/2021 by Rod Can, CNM No  ? Overview Addendum 08/04/2021  1:40 PM by Imagene Riches, CNM  ?   ?Nursing Staff Provider  ?Office Location  [facility] Dating  L=11  ?Language  English Anatomy US    ?Flu Vaccine   Genetic Screen  NIPS: nml XY  ?TDaP vaccine    Hgb A1C or  ?GTT Early : BMI 28 pre pregnancy.  ?A1C 4.9 ?Third trimester : 136  ?Covid    LAB RESULTS   ?Rhogam   Blood Type   A+  ?Feeding Plan Breast Antibody  negative  ?Contraception BTL Rubella  immune  ?Circumcision  RPR   NR  ?Pediatrician   HBsAg   negative  ?Support Person [name] HIV  negative  ?Prenatal Classes  Varicella immune  ?CS Sch 39 weeks GBS  (For PCN allergy, check sensitivities)   ?BTL Consent None needed, but desires  Hepatitis C negative  ?VBAC Consent No Pap  2022 LGSIL- repeat postpartum  ?  Hgb Electro    ?Pelvis Tested  CF   ?   SMA   ?     ? ?  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 ?  ?  ? Advanced maternal age in multigravida 04/13/2021 by Rod Can, CNM No  ? Hx of preeclampsia, prior pregnancy, currently pregnant 04/13/2021 by Rod Can, CNM No  ? History of 2  cesarean sections 04/13/2021 by Rod Can, CNM No  ? Overview Signed 05/17/2021  4:48 PM by Gae Dry, MD  ?  OB/GYN  Counseling Note ? ?41 y.o. [phone] at [redacted]w[redacted]d with Estimated Date of Delivery: 11/04/21 was seen today in office to discuss trial of labor after cesarean section (TOLAC) versus elective repeat cesarean delivery (ERCD). The following risks were discussed with the patient. ? ?Risk of uterine rupture at term is 0.78 percent with TOLAC and 0.22 percent with ERCD. 1 in 10 uterine ruptures will result in neonatal death or neurological injury. ?The benefits of a trial of labor after cesarean (TOLAC) resulting in a vaginal birth after cesarean (VBAC) include the following: shorter length of hospital stay and postpartum recovery (in most cases); fewer complications, such as postpartum fever, wound or uterine infection, thromboembolism (blood clots in the leg or lung), need for blood transfusion and fewer neonatal breathing problems. ? ?The risks of an attempted VBAC or TOLAC include the following: ?Risk of failed trial of labor after cesarean (TOLAC) without a vaginal birth after cesarean (VBAC) resulting in repeat cesarean delivery (RCD) in about 20 to 44 percent of women who attempt VBAC.  ?Risk of rupture of uterus resulting in an emergency cesarean delivery. The risk of uterine rupture may be related in part to the type of uterine incision made during the first cesarean delivery. A previous transverse uterine incision has the lowest risk of rupture (0.2 to 1.5 percent risk). Vertical or T-shaped uterine incisions have a higher risk of uterine rupture (4 to 9 percent risk)The risk of fetal death is very low with both VBAC and elective repeat cesarean delivery (ERCD), but the likelihood of fetal death is higher with VBAC than with ERCD. Maternal death is very rare with either type of delivery. ? ?The risks of an elective repeat cesarean delivery (ERCD) were reviewed with the patient including but  not limited to: 08/998 risk of uterine rupture which could have serious consequences, bleeding which may require transfusion; infection which may require antibiotics; injury to bowel, bladder or other surrounding organs (bowel, bladder, ureters); injury to the fetus; need for additional procedures including hysterectomy in the event of a life-threatening hemorrhage; thromboembolic phenomenon; abnormal placentation; incisional problems; death and other postoperative or anesthesia complications.   ? ?Pt desires CS (w BTL) ?  ?  ? ?  ?  ? ?Preterm labor symptoms and general obstetric precautions including but not limited to vaginal bleeding, contractions, leaking of fluid and fetal movement were reviewed in detail with the patient. ?Please refer to After Visit Summary for other counseling recommendations.  ? ?Return in about 1 week (around 10/11/2021) for ROB, NST. ? ?36 wks labs next visit  ? ?Roberto Scales, CNM  ?Mosetta Pigeon, [place]  ?10/04/21  ?1:12 PM  ? ? ?

## 2021-10-05 ENCOUNTER — Ambulatory Visit: Payer: 59 | Admitting: *Deleted

## 2021-10-05 ENCOUNTER — Ambulatory Visit: Payer: 59 | Attending: Obstetrics and Gynecology

## 2021-10-05 ENCOUNTER — Encounter: Payer: Self-pay | Admitting: *Deleted

## 2021-10-05 VITALS — BP 120/66 | HR 81

## 2021-10-05 DIAGNOSIS — O09523 Supervision of elderly multigravida, third trimester: Secondary | ICD-10-CM

## 2021-10-05 DIAGNOSIS — O403XX0 Polyhydramnios, third trimester, not applicable or unspecified: Secondary | ICD-10-CM

## 2021-10-05 DIAGNOSIS — Z98891 History of uterine scar from previous surgery: Secondary | ICD-10-CM | POA: Insufficient documentation

## 2021-10-05 DIAGNOSIS — O09299 Supervision of pregnancy with other poor reproductive or obstetric history, unspecified trimester: Secondary | ICD-10-CM | POA: Insufficient documentation

## 2021-10-05 DIAGNOSIS — O09293 Supervision of pregnancy with other poor reproductive or obstetric history, third trimester: Secondary | ICD-10-CM

## 2021-10-05 DIAGNOSIS — O0990 Supervision of high risk pregnancy, unspecified, unspecified trimester: Secondary | ICD-10-CM

## 2021-10-05 DIAGNOSIS — O409XX0 Polyhydramnios, unspecified trimester, not applicable or unspecified: Secondary | ICD-10-CM | POA: Diagnosis present

## 2021-10-05 DIAGNOSIS — O34219 Maternal care for unspecified type scar from previous cesarean delivery: Secondary | ICD-10-CM | POA: Diagnosis not present

## 2021-10-05 DIAGNOSIS — Z3A35 35 weeks gestation of pregnancy: Secondary | ICD-10-CM

## 2021-10-05 DIAGNOSIS — O09213 Supervision of pregnancy with history of pre-term labor, third trimester: Secondary | ICD-10-CM

## 2021-10-11 ENCOUNTER — Encounter: Payer: 59 | Admitting: Licensed Practical Nurse

## 2021-10-11 ENCOUNTER — Telehealth: Payer: Self-pay

## 2021-10-11 NOTE — Telephone Encounter (Signed)
Pt is wanting to have a letter to pull her out of work as of today. 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 Pt states the letter can be sent to her in MyChart. ?

## 2021-10-12 ENCOUNTER — Ambulatory Visit: Payer: 59 | Attending: Obstetrics and Gynecology

## 2021-10-12 ENCOUNTER — Encounter: Payer: Self-pay | Admitting: *Deleted

## 2021-10-12 ENCOUNTER — Encounter: Payer: Self-pay | Admitting: Obstetrics and Gynecology

## 2021-10-12 ENCOUNTER — Encounter: Payer: Self-pay | Admitting: Licensed Practical Nurse

## 2021-10-12 ENCOUNTER — Ambulatory Visit: Payer: 59 | Admitting: *Deleted

## 2021-10-12 VITALS — BP 139/70 | HR 83

## 2021-10-12 DIAGNOSIS — O09523 Supervision of elderly multigravida, third trimester: Secondary | ICD-10-CM | POA: Diagnosis not present

## 2021-10-12 DIAGNOSIS — O09299 Supervision of pregnancy with other poor reproductive or obstetric history, unspecified trimester: Secondary | ICD-10-CM | POA: Diagnosis present

## 2021-10-12 DIAGNOSIS — O09293 Supervision of pregnancy with other poor reproductive or obstetric history, third trimester: Secondary | ICD-10-CM | POA: Diagnosis not present

## 2021-10-12 DIAGNOSIS — Z98891 History of uterine scar from previous surgery: Secondary | ICD-10-CM

## 2021-10-12 DIAGNOSIS — O409XX0 Polyhydramnios, unspecified trimester, not applicable or unspecified: Secondary | ICD-10-CM | POA: Diagnosis present

## 2021-10-12 DIAGNOSIS — O0990 Supervision of high risk pregnancy, unspecified, unspecified trimester: Secondary | ICD-10-CM | POA: Insufficient documentation

## 2021-10-12 DIAGNOSIS — O34219 Maternal care for unspecified type scar from previous cesarean delivery: Secondary | ICD-10-CM | POA: Diagnosis not present

## 2021-10-12 DIAGNOSIS — O403XX0 Polyhydramnios, third trimester, not applicable or unspecified: Secondary | ICD-10-CM | POA: Diagnosis not present

## 2021-10-12 DIAGNOSIS — Z3A36 36 weeks gestation of pregnancy: Secondary | ICD-10-CM

## 2021-10-12 DIAGNOSIS — O99213 Obesity complicating pregnancy, third trimester: Secondary | ICD-10-CM

## 2021-10-12 NOTE — Telephone Encounter (Signed)
Patient is calling to follow up on note needed. Please advise

## 2021-10-13 ENCOUNTER — Ambulatory Visit (INDEPENDENT_AMBULATORY_CARE_PROVIDER_SITE_OTHER): Payer: 59 | Admitting: Obstetrics and Gynecology

## 2021-10-13 ENCOUNTER — Encounter: Payer: Self-pay | Admitting: Obstetrics and Gynecology

## 2021-10-13 ENCOUNTER — Other Ambulatory Visit (HOSPITAL_COMMUNITY)
Admission: RE | Admit: 2021-10-13 | Discharge: 2021-10-13 | Disposition: A | Discharge status: Home / Self Care | Payer: 59 | Source: Ambulatory Visit | Attending: Obstetrics and Gynecology | Admitting: Obstetrics and Gynecology

## 2021-10-13 VITALS — BP 122/62 | Wt 204.0 lb

## 2021-10-13 DIAGNOSIS — O09523 Supervision of elderly multigravida, third trimester: Secondary | ICD-10-CM

## 2021-10-13 DIAGNOSIS — O0990 Supervision of high risk pregnancy, unspecified, unspecified trimester: Secondary | ICD-10-CM

## 2021-10-13 DIAGNOSIS — O3663X0 Maternal care for excessive fetal growth, third trimester, not applicable or unspecified: Secondary | ICD-10-CM

## 2021-10-13 DIAGNOSIS — O09299 Supervision of pregnancy with other poor reproductive or obstetric history, unspecified trimester: Secondary | ICD-10-CM

## 2021-10-13 DIAGNOSIS — Z98891 History of uterine scar from previous surgery: Secondary | ICD-10-CM

## 2021-10-13 DIAGNOSIS — Z3A36 36 weeks gestation of pregnancy: Secondary | ICD-10-CM

## 2021-10-13 DIAGNOSIS — O403XX0 Polyhydramnios, third trimester, not applicable or unspecified: Secondary | ICD-10-CM

## 2021-10-13 LAB — POCT URINALYSIS DIPSTICK OB
Glucose, UA: NEGATIVE
POC,PROTEIN,UA: NEGATIVE

## 2021-10-13 LAB — FETAL NONSTRESS TEST

## 2021-10-13 NOTE — Progress Notes (Signed)
? ?  PRENATAL VISIT NOTE ? ?Subjective:  ?Kimberly Holder is a 41 y.o. [id number] at [redacted]w[redacted]d being seen today for ongoing prenatal care.  She is currently monitored for the following issues for this high-risk pregnancy and has Preventative health care; Hirsutism; PCOS (polycystic ovarian syndrome); Atypical squamous cells of undetermined significance (ASCUS) on Papanicolaou smear of cervix; Overweight (BMI 25.0-29.9); History of melasma; Supervision of high risk pregnancy, antepartum; Advanced maternal age in multigravida; Hx of preeclampsia, prior pregnancy, currently pregnant; History of 2 cesarean sections; Vitamin D deficiency; Polyhydramnios affecting pregnancy in third trimester; and Large for gestational age fetus affecting management of mother on their problem list. ? ?Patient reports no complaints.  Contractions: Irregular. Vag. Bleeding: None.  Movement: Present. Denies leaking of fluid.  ? ?The following portions of the patient's history were reviewed and updated as appropriate: allergies, current medications, past family history, past medical history, past social history, past surgical history and problem list.  ? ?Objective:  ? ?Vitals:  ? 10/13/21 0831  ?BP: 122/62  ?Weight: 204 lb (92.5 kg)  ? ? ?Fetal Status:     Movement: Present    ? ?General:  Alert, oriented and cooperative. Patient is in no acute distress.  ?Skin: Skin is warm and dry. No rash noted.   ?Cardiovascular: Normal heart rate noted  ?Respiratory: Normal respiratory effort, no problems with respiration noted  ?Abdomen: Soft, gravid, appropriate for gestational age.  Pain/Pressure: Present     ?Pelvic: Cervical exam deferred        ?Extremities: Normal range of motion.     ?Mental Status: Normal mood and affect. Normal behavior. Normal judgment and thought content.  ? ?Assessment and Plan:  ?Pregnancy: [id number] at [redacted]w[redacted]d ?1. Supervision of high risk pregnancy, antepartum ?Patient is doing well without complaints ?Cultures today ?- POC  Urinalysis Dipstick OB ? ?2. [redacted] weeks gestation of pregnancy ? ?- POC Urinalysis Dipstick OB ? ?3. Polyhydramnios affecting pregnancy in third trimester ?Continue weekly antenatal testing ?BPP 8/8 yesterday ?NST reviewed and reactive with baseline 135, mod variability, +accels, no decels ? ?4. Excessive fetal growth affecting management of pregnancy in third trimester, single or unspecified fetus ? ? ?5. History of 2 cesarean sections ?Scheduled for repeat c-section with BTL ? ?6. Hx of preeclampsia, prior pregnancy, currently pregnant ?Normotensive ? ?7. Multigravida of advanced maternal age in third trimester ? ? ?Preterm labor symptoms and general obstetric precautions including but not limited to vaginal bleeding, contractions, leaking of fluid and fetal movement were reviewed in detail with the patient. ?Please refer to After Visit Summary for other counseling recommendations.  ? ?Return in about 1 week (around 10/20/2021) for in person, ROB, High risk, NST. ? ?Future Appointments  ?Date Time Provider [place]  ?10/18/2021  9:00 AM [facility] PAT1 [facility] None  ?10/20/2021  7:30 AM [facility] NURSE [facility]  ?10/20/2021  7:45 AM [facility] US5 [facility]  ?10/22/2021  8:55 AM Imagene Riches, CNM [facility] None  ?10/26/2021  8:55 AM Imagene Riches, CNM [facility] None  ?10/27/2021  8:45 AM [facility] PAT4 [facility] None  ?04/27/2022  8:40 AM McLean-Scocuzza, Nino Glow, MD [facility]  ? ? ?Mora Bellman, MD ? ?

## 2021-10-14 LAB — CERVICOVAGINAL ANCILLARY ONLY
Chlamydia: NEGATIVE
Comment: NEGATIVE
Comment: NORMAL
Neisseria Gonorrhea: NEGATIVE

## 2021-10-17 LAB — CULTURE, BETA STREP (GROUP B ONLY): Strep Gp B Culture: NEGATIVE

## 2021-10-18 ENCOUNTER — Encounter
Admission: RE | Admit: 2021-10-18 | Discharge: 2021-10-18 | Disposition: A | Discharge status: Home / Self Care | Payer: 59 | Source: Ambulatory Visit | Attending: Obstetrics and Gynecology | Admitting: Obstetrics and Gynecology

## 2021-10-18 NOTE — Patient Instructions (Addendum)
Arrival Time: Please call Labor and Delivery the day before your scheduled C-Section to find out your arrival time. [phone]. ? ?Arrival: If your arrival time is prior to 6:00 am, please enter through the Emergency Room Entrance and you will be directed to Labor and Delivery. If your arrival time is 6:00 am or later, please enter the Medical Mall and follow the greeter's instructions. ? ?Labor and Delivery ? ?Laboring women (regardless of age) may have one designated support person and one additional visitor age 69+ at a time, and a doula registered with [facility] for the labor and delivery phase of their stay. (Doulas not registered with [facility] are considered visitors.) The visitor (not the designated support person) may rotate out with other people. ?Children under 16 are not allowed. ?The designated support person or the visitor may stay overnight in the room during a multi-night stay. ? ?Mother Baby Unit, OB Specialty and Gynecological Care ? ?A designated support person and up to two additional visitors of any age are allowed at one time in the room.  ?The two visitors (not the designated support person) may rotate out with other people throughout the patient?s stay. ?The designated support person or a visitor age 42+ may stay overnight in the room during a multi-night stay.  ? ?Your procedure is scheduled on: Friday, April 28 ? ?REMEMBER: ?Instructions that are not followed completely may result in serious medical risk, up to and including death; or upon the discretion of your surgeon and anesthesiologist your surgery may need to be rescheduled. ? ?Do not eat food after midnight the night before surgery.  ?No gum chewing, lozengers or hard candies. ? ?You may however, drink CLEAR liquids up to 2 hours before you are scheduled to arrive for your surgery. Do not drink anything within 2 hours of your scheduled arrival time. ? ?Clear liquids include: ?- water  ?- apple juice without pulp ?- gatorade  (not RED colors) ?- black coffee or tea (Do NOT add milk or creamers to the coffee or tea) ?Do NOT drink anything that is not on this list. ? ?DO NOT TAKE ANY MEDICATIONS THE MORNING OF SURGERY ? ?One week prior to surgery: STARTING April 21 ?Stop ASPIRIN and Anti-inflammatories (NSAIDS) such as Advil, Aleve, Ibuprofen, Motrin, Naproxen, Naprosyn and Aspirin based products such as Excedrin, Goodys Powder, BC Powder. ?Stop ANY OVER THE COUNTER supplements until after surgery. ?You may however, continue to take Tylenol if needed for pain up until the day of surgery. ? ?No Alcohol for 24 hours before or after surgery. ? ?No Smoking including e-cigarettes for 24 hours prior to surgery.  ?No chewable tobacco products for at least 6 hours prior to surgery.  ?No nicotine patches on the day of surgery. ? ?Do not use any "recreational" drugs for at least a week prior to your surgery.  ?Please be advised that the combination of cocaine and anesthesia may have negative outcomes, up to and including death. ?If you test positive for cocaine, your surgery will be cancelled. ? ?On the morning of surgery brush your teeth with toothpaste and water, you may rinse your mouth with mouthwash if you wish. ?Do not swallow any toothpaste or mouthwash. ? ?Use CHG wipes as directed on instruction sheet. ? ?Do not wear jewelry, make-up, hairpins, clips or nail polish. ? ?Do not wear lotions, powders, or perfumes.  ? ?Do not shave body from the neck down 48 hours prior to surgery just in case you cut yourself which  could leave a site for infection.  ?Also, freshly shaved skin may become irritated if using the CHG soap. ? ?Contact lenses, hearing aids and dentures may not be worn into surgery. ? ?Do not bring valuables to the hospital. [facility] is not responsible for any missing/lost belongings or valuables.  ? ?Notify your doctor if there is any change in your medical condition (cold, fever, infection). ? ?Wear comfortable clothing  (specific to your surgery type) to the hospital. ? ?After surgery, you can help prevent lung complications by doing breathing exercises.  ?Take deep breaths and cough every 1-2 hours. Your doctor may order a device called an Incentive Spirometer to help you take deep breaths. ?When coughing or sneezing, hold a pillow firmly against your incision with both hands. This is called ?splinting.? Doing this helps protect your incision. It also decreases belly discomfort. ? ?Please call the Pre-admissions Testing Dept. at [phone] if you have any questions about these instructions. ? ?

## 2021-10-20 ENCOUNTER — Ambulatory Visit: Payer: 59 | Admitting: *Deleted

## 2021-10-20 ENCOUNTER — Ambulatory Visit: Payer: 59 | Attending: Obstetrics and Gynecology

## 2021-10-20 ENCOUNTER — Encounter: Payer: Self-pay | Admitting: *Deleted

## 2021-10-20 VITALS — BP 126/81 | HR 89

## 2021-10-20 DIAGNOSIS — O403XX0 Polyhydramnios, third trimester, not applicable or unspecified: Secondary | ICD-10-CM

## 2021-10-20 DIAGNOSIS — O09293 Supervision of pregnancy with other poor reproductive or obstetric history, third trimester: Secondary | ICD-10-CM | POA: Diagnosis not present

## 2021-10-20 DIAGNOSIS — O0990 Supervision of high risk pregnancy, unspecified, unspecified trimester: Secondary | ICD-10-CM

## 2021-10-20 DIAGNOSIS — Z98891 History of uterine scar from previous surgery: Secondary | ICD-10-CM | POA: Diagnosis present

## 2021-10-20 DIAGNOSIS — Z6835 Body mass index (BMI) 35.0-35.9, adult: Secondary | ICD-10-CM | POA: Insufficient documentation

## 2021-10-20 DIAGNOSIS — O09523 Supervision of elderly multigravida, third trimester: Secondary | ICD-10-CM

## 2021-10-20 DIAGNOSIS — O26843 Uterine size-date discrepancy, third trimester: Secondary | ICD-10-CM | POA: Insufficient documentation

## 2021-10-20 DIAGNOSIS — O09299 Supervision of pregnancy with other poor reproductive or obstetric history, unspecified trimester: Secondary | ICD-10-CM

## 2021-10-20 DIAGNOSIS — O409XX0 Polyhydramnios, unspecified trimester, not applicable or unspecified: Secondary | ICD-10-CM | POA: Diagnosis present

## 2021-10-20 DIAGNOSIS — O99213 Obesity complicating pregnancy, third trimester: Secondary | ICD-10-CM

## 2021-10-20 DIAGNOSIS — Z3A37 37 weeks gestation of pregnancy: Secondary | ICD-10-CM

## 2021-10-20 DIAGNOSIS — O34219 Maternal care for unspecified type scar from previous cesarean delivery: Secondary | ICD-10-CM | POA: Diagnosis present

## 2021-10-20 DIAGNOSIS — E669 Obesity, unspecified: Secondary | ICD-10-CM

## 2021-10-22 ENCOUNTER — Ambulatory Visit (INDEPENDENT_AMBULATORY_CARE_PROVIDER_SITE_OTHER): Payer: 59 | Admitting: Obstetrics

## 2021-10-22 VITALS — BP 122/70 | Wt 206.0 lb

## 2021-10-22 DIAGNOSIS — Z3A38 38 weeks gestation of pregnancy: Secondary | ICD-10-CM

## 2021-10-22 DIAGNOSIS — O403XX0 Polyhydramnios, third trimester, not applicable or unspecified: Secondary | ICD-10-CM

## 2021-10-22 DIAGNOSIS — O09523 Supervision of elderly multigravida, third trimester: Secondary | ICD-10-CM | POA: Diagnosis not present

## 2021-10-22 DIAGNOSIS — O3660X0 Maternal care for excessive fetal growth, unspecified trimester, not applicable or unspecified: Secondary | ICD-10-CM

## 2021-10-22 DIAGNOSIS — O0990 Supervision of high risk pregnancy, unspecified, unspecified trimester: Secondary | ICD-10-CM

## 2021-10-22 LAB — POCT URINALYSIS DIPSTICK OB
Glucose, UA: NEGATIVE
POC,PROTEIN,UA: NEGATIVE

## 2021-10-22 LAB — FETAL NONSTRESS TEST

## 2021-10-22 NOTE — Progress Notes (Signed)
Routine Prenatal Care Visit ? ?Subjective  ?Kimberly Holder is a 42 y.o. [id number] at [redacted]w[redacted]d being seen today for ongoing prenatal care.  She is currently monitored for the following issues for this high-risk pregnancy and has Preventative health care; Hirsutism; PCOS (polycystic ovarian syndrome); Atypical squamous cells of undetermined significance (ASCUS) on Papanicolaou smear of cervix; Overweight (BMI 25.0-29.9); History of melasma; Supervision of high risk pregnancy, antepartum; Advanced maternal age in multigravida; Hx of preeclampsia, prior pregnancy, currently pregnant; History of 2 cesarean sections; Vitamin D deficiency; Polyhydramnios affecting pregnancy in third trimester; and Large for gestational age fetus affecting management of mother on their problem list.  ?----------------------------------------------------------------------------------- ?Patient reports no complaints. She is scheduled for a repeat CS next Friday.Doing well- able to sleep at night. Had a scan yesterday and shares that the EFW is 10 lbs.  ?Contractions: Irritability. Vag. Bleeding: None.  Movement: Present. Leaking Fluid denies.  ?----------------------------------------------------------------------------------- ?The following portions of the patient's history were reviewed and updated as appropriate: allergies, current medications, past family history, past medical history, past social history, past surgical history and problem list. Problem list updated. ? ?Objective  ?Blood pressure 122/70, weight 206 lb (93.4 kg), last menstrual period 01/28/2021. ?Pregravid weight 152 lb (68.9 kg) Total Weight Gain 54 lb (24.5 kg) ?Urinalysis: Urine Protein    Urine Glucose   ? ?Fetal Status:     Movement: Present    ? ?General:  Alert, oriented and cooperative. Patient is in no acute distress.  ?Skin: Skin is warm and dry. No rash noted.   ?Cardiovascular: Normal heart rate noted  ?Respiratory: Normal respiratory effort, no problems with  respiration noted  ?Abdomen: Soft, gravid, appropriate for gestational age. Pain/Pressure: Absent     ?Pelvic:  closed/thick /high        ?Extremities: Normal range of motion.     ?Mental Status: Normal mood and affect. Normal behavior. Normal judgment and thought content.  ? ?Assessment  ? ?41 y.o. [id number] at [redacted]w[redacted]d by  11/04/2021, by Last Menstrual Period presenting for routine prenatal visit ? ?Plan  ? ?October 2022 Problems (from 04/13/21 to present)   ? Problem Noted Resolved  ? Large for gestational age fetus affecting management of mother 09/02/2021 by Rod Can, CNM No  ? Polyhydramnios affecting pregnancy in third trimester 08/26/2021 by Allen Derry, CNM No  ? Supervision of high risk pregnancy, antepartum 04/13/2021 by Rod Can, CNM No  ? Overview Addendum 10/18/2021  8:19 AM by Mora Bellman, MD  ?   ?Nursing Staff Provider  ?Office Location  [facility] Dating  L=11  ?Language  English Anatomy US    ?Flu Vaccine   Genetic Screen  NIPS: nml XY  ?TDaP vaccine    Hgb A1C or  ?GTT Early : BMI 28 pre pregnancy.  ?A1C 4.9 ?Third trimester : 136  ?Covid    LAB RESULTS   ?Rhogam   Blood Type   A+  ?Feeding Plan Breast Antibody  negative  ?Contraception BTL Rubella  immune  ?Circumcision  RPR   NR  ?Pediatrician   HBsAg   negative  ?Support Person [name] HIV  negative  ?Prenatal Classes  Varicella immune  ?CS Sch 39 weeks GBS   negative (For PCN allergy, check sensitivities)   ?BTL Consent None needed, but desires  Hepatitis C negative  ?VBAC Consent No Pap  2022 LGSIL- repeat postpartum  ?  Hgb Electro    ?Pelvis Tested  CF   ?   SMA   ?     ? ?  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 ?  ?  ? Advanced maternal age in multigravida 04/13/2021 by Rod Can, CNM No  ? Hx of preeclampsia, prior pregnancy, currently pregnant 04/13/2021 by Rod Can, CNM No  ? History of 2 cesarean sections 04/13/2021 by Rod Can, CNM No   ? Overview Signed 05/17/2021  4:48 PM by Gae Dry, MD  ?  OB/GYN  Counseling Note ? ?41 y.o. [phone] at [redacted]w[redacted]d with Estimated Date of Delivery: 11/04/21 was seen today in office to discuss trial of labor after cesarean section (TOLAC) versus elective repeat cesarean delivery (ERCD). The following risks were discussed with the patient. ? ?Risk of uterine rupture at term is 0.78 percent with TOLAC and 0.22 percent with ERCD. 1 in 10 uterine ruptures will result in neonatal death or neurological injury. ?The benefits of a trial of labor after cesarean (TOLAC) resulting in a vaginal birth after cesarean (VBAC) include the following: shorter length of hospital stay and postpartum recovery (in most cases); fewer complications, such as postpartum fever, wound or uterine infection, thromboembolism (blood clots in the leg or lung), need for blood transfusion and fewer neonatal breathing problems. ? ?The risks of an attempted VBAC or TOLAC include the following: ?Risk of failed trial of labor after cesarean (TOLAC) without a vaginal birth after cesarean (VBAC) resulting in repeat cesarean delivery (RCD) in about 20 to 37 percent of women who attempt VBAC.  ?Risk of rupture of uterus resulting in an emergency cesarean delivery. The risk of uterine rupture may be related in part to the type of uterine incision made during the first cesarean delivery. A previous transverse uterine incision has the lowest risk of rupture (0.2 to 1.5 percent risk). Vertical or T-shaped uterine incisions have a higher risk of uterine rupture (4 to 9 percent risk)The risk of fetal death is very low with both VBAC and elective repeat cesarean delivery (ERCD), but the likelihood of fetal death is higher with VBAC than with ERCD. Maternal death is very rare with either type of delivery. ? ?The risks of an elective repeat cesarean delivery (ERCD) were reviewed with the patient including but not limited to: 08/998 risk of uterine rupture which  could have serious consequences, bleeding which may require transfusion; infection which may require antibiotics; injury to bowel, bladder or other surrounding organs (bowel, bladder, ureters); injury to the fetus; need for additional procedures including hysterectomy in the event of a life-threatening hemorrhage; thromboembolic phenomenon; abnormal placentation; incisional problems; death and other postoperative or anesthesia complications.   ? ?Pt desires CS (w BTL) ?  ?  ?  ?  ? ?Term labor symptoms and general obstetric precautions including but not limited to vaginal bleeding, contractions, leaking of fluid and fetal movement were reviewed in detail with the patient. ?Please refer to After Visit Summary for other counseling recommendations.  ?She still plans on her repeat CS and a BTL ? ?Return in about 1 week (around 10/29/2021) for return OB , NST. ? ?Imagene Riches, CNM  ?10/22/2021 9:31 AM   ? ?

## 2021-10-22 NOTE — Addendum Note (Signed)
Addended by: Burtis Junes on: 10/22/2021 10:03 AM ? ? Modules accepted: Orders ? ?

## 2021-10-25 ENCOUNTER — Encounter: Payer: 59 | Admitting: Obstetrics

## 2021-10-26 ENCOUNTER — Ambulatory Visit (INDEPENDENT_AMBULATORY_CARE_PROVIDER_SITE_OTHER): Payer: 59 | Admitting: Obstetrics

## 2021-10-26 VITALS — BP 120/78 | Wt 208.0 lb

## 2021-10-26 DIAGNOSIS — O0990 Supervision of high risk pregnancy, unspecified, unspecified trimester: Secondary | ICD-10-CM | POA: Diagnosis not present

## 2021-10-26 DIAGNOSIS — Z3A38 38 weeks gestation of pregnancy: Secondary | ICD-10-CM

## 2021-10-26 LAB — FETAL NONSTRESS TEST

## 2021-10-26 LAB — POCT URINALYSIS DIPSTICK OB
Glucose, UA: NEGATIVE
POC,PROTEIN,UA: NEGATIVE

## 2021-10-26 NOTE — Progress Notes (Signed)
Routine Prenatal Care Visit ? ?Subjective  ?Kimberly Holder is a 41 y.o. [id number] at [redacted]w[redacted]d being seen today for ongoing prenatal care.  She is currently monitored for the following issues for this high-risk pregnancy and has Preventative health care; Hirsutism; PCOS (polycystic ovarian syndrome); Atypical squamous cells of undetermined significance (ASCUS) on Papanicolaou smear of cervix; Overweight (BMI 25.0-29.9); History of melasma; Supervision of high risk pregnancy, antepartum; Advanced maternal age in multigravida; Hx of preeclampsia, prior pregnancy, currently pregnant; History of 2 cesarean sections; Vitamin D deficiency; Polyhydramnios affecting pregnancy in third trimester; and Large for gestational age fetus affecting management of mother on their problem list.  ?----------------------------------------------------------------------------------- ?Patient reports no complaints.  She is having her CS in three days. The baby is moving well.Lovely reactive NST this morning- Cat 1. ?Contractions: Irritability. Vag. Bleeding: None.  Movement: Present. Leaking Fluid denies.  ?----------------------------------------------------------------------------------- ?The following portions of the patient's history were reviewed and updated as appropriate: allergies, current medications, past family history, past medical history, past social history, past surgical history and problem list. Problem list updated. ? ?Objective  ?Blood pressure 120/78, weight 208 lb (94.3 kg), last menstrual period 01/28/2021. ?Pregravid weight 152 lb (68.9 kg) Total Weight Gain 56 lb (25.4 kg) ?Urinalysis: Urine Protein    Urine Glucose   ? ?Fetal Status:     Movement: Present    ? ?General:  Alert, oriented and cooperative. Patient is in no acute distress.  ?Skin: Skin is warm and dry. No rash noted.   ?Cardiovascular: Normal heart rate noted  ?Respiratory: Normal respiratory effort, no problems with respiration noted  ?Abdomen: Soft,  gravid, appropriate for gestational age. Pain/Pressure: Absent     ?Pelvic:  Cervical exam deferred        ?Extremities: Normal range of motion.     ?Mental Status: Normal mood and affect. Normal behavior. Normal judgment and thought content.  ? ?Assessment  ? ?41 y.o. [id number] at [redacted]w[redacted]d by  11/04/2021, by Last Menstrual Period presenting for routine prenatal visit ? ?Plan  ? ?October 2022 Problems (from 04/13/21 to present)   ? Problem Noted Resolved  ? Large for gestational age fetus affecting management of mother 09/02/2021 by Rod Can, CNM No  ? Polyhydramnios affecting pregnancy in third trimester 08/26/2021 by Allen Derry, CNM No  ? Supervision of high risk pregnancy, antepartum 04/13/2021 by Rod Can, CNM No  ? Overview Addendum 10/18/2021  8:19 AM by Mora Bellman, MD  ?   ?Nursing Staff Provider  ?Office Location  [facility] Dating  L=11  ?Language  English Anatomy US    ?Flu Vaccine   Genetic Screen  NIPS: nml XY  ?TDaP vaccine    Hgb A1C or  ?GTT Early : BMI 28 pre pregnancy.  ?A1C 4.9 ?Third trimester : 136  ?Covid    LAB RESULTS   ?Rhogam   Blood Type   A+  ?Feeding Plan Breast Antibody  negative  ?Contraception BTL Rubella  immune  ?Circumcision  RPR   NR  ?Pediatrician   HBsAg   negative  ?Support Person [name] HIV  negative  ?Prenatal Classes  Varicella immune  ?CS Sch 39 weeks GBS   negative (For PCN allergy, check sensitivities)   ?BTL Consent None needed, but desires  Hepatitis C negative  ?VBAC Consent No Pap  2022 LGSIL- repeat postpartum  ?  Hgb Electro    ?Pelvis Tested  CF   ?   SMA   ?     ? 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 ?  ?  ? Advanced maternal age in multigravida 04/13/2021 by Rod Can, CNM No  ? Hx of preeclampsia, prior pregnancy, currently pregnant 04/13/2021 by Rod Can, CNM No  ? History of 2 cesarean sections 04/13/2021 by Rod Can, CNM No  ? Overview Signed 05/17/2021   4:48 PM by Gae Dry, MD  ?  OB/GYN  Counseling Note ? ?41 y.o. [id number] at [redacted]w[redacted]d with Estimated Date of Delivery: 11/04/21 was seen today in office to discuss trial of labor after cesarean section (TOLAC) versus elective repeat cesarean delivery (ERCD). The following risks were discussed with the patient. ? ?Risk of uterine rupture at term is 0.78 percent with TOLAC and 0.22 percent with ERCD. 1 in 10 uterine ruptures will result in neonatal death or neurological injury. ?The benefits of a trial of labor after cesarean (TOLAC) resulting in a vaginal birth after cesarean (VBAC) include the following: shorter length of hospital stay and postpartum recovery (in most cases); fewer complications, such as postpartum fever, wound or uterine infection, thromboembolism (blood clots in the leg or lung), need for blood transfusion and fewer neonatal breathing problems. ? ?The risks of an attempted VBAC or TOLAC include the following: ?Risk of failed trial of labor after cesarean (TOLAC) without a vaginal birth after cesarean (VBAC) resulting in repeat cesarean delivery (RCD) in about 20 to 49 percent of women who attempt VBAC.  ?Risk of rupture of uterus resulting in an emergency cesarean delivery. The risk of uterine rupture may be related in part to the type of uterine incision made during the first cesarean delivery. A previous transverse uterine incision has the lowest risk of rupture (0.2 to 1.5 percent risk). Vertical or T-shaped uterine incisions have a higher risk of uterine rupture (4 to 9 percent risk)The risk of fetal death is very low with both VBAC and elective repeat cesarean delivery (ERCD), but the likelihood of fetal death is higher with VBAC than with ERCD. Maternal death is very rare with either type of delivery. ? ?The risks of an elective repeat cesarean delivery (ERCD) were reviewed with the patient including but not limited to: 08/998 risk of uterine rupture which could have serious consequences,  bleeding which may require transfusion; infection which may require antibiotics; injury to bowel, bladder or other surrounding organs (bowel, bladder, ureters); injury to the fetus; need for additional procedures including hysterectomy in the event of a life-threatening hemorrhage; thromboembolic phenomenon; abnormal placentation; incisional problems; death and other postoperative or anesthesia complications.   ? ?Pt desires CS (w BTL) ?  ?  ?  ?  ? ?Term labor symptoms and general obstetric precautions including but not limited to vaginal bleeding, contractions, leaking of fluid and fetal movement were reviewed in detail with the patient. ?Please refer to After Visit Summary for other counseling recommendations.  ? ?Return if symptoms worsen or fail to improve. ? ?Imagene Riches, CNM  ?10/26/2021 9:53 AM   ? ?

## 2021-10-26 NOTE — Progress Notes (Signed)
No vb. No lof. NST today °

## 2021-10-26 NOTE — Addendum Note (Signed)
Addended by: Burtis Junes on: 10/26/2021 09:58 AM ? ? Modules accepted: Orders ? ?

## 2021-10-27 ENCOUNTER — Encounter
Admission: RE | Admit: 2021-10-27 | Discharge: 2021-10-27 | Disposition: A | Discharge status: Home / Self Care | Payer: 59 | Source: Ambulatory Visit | Attending: Obstetrics and Gynecology | Admitting: Obstetrics and Gynecology

## 2021-10-27 ENCOUNTER — Other Ambulatory Visit: Payer: 59

## 2021-10-27 DIAGNOSIS — O0990 Supervision of high risk pregnancy, unspecified, unspecified trimester: Secondary | ICD-10-CM | POA: Insufficient documentation

## 2021-10-27 DIAGNOSIS — Z01812 Encounter for preprocedural laboratory examination: Secondary | ICD-10-CM | POA: Insufficient documentation

## 2021-10-28 ENCOUNTER — Encounter: Payer: Self-pay | Admitting: Urgent Care

## 2021-10-28 LAB — TYPE AND SCREEN
ABO/RH(D): A POS
Antibody Screen: NEGATIVE
Extend sample reason: UNDETERMINED

## 2021-10-28 LAB — RPR: RPR Ser Ql: NONREACTIVE

## 2021-10-28 MED ORDER — FAMOTIDINE 20 MG PO TABS
20.0000 mg | ORAL_TABLET | Freq: Once | ORAL | Status: AC
Start: 2021-10-28 — End: 2021-10-29
  Administered 2021-10-29: 20 mg via ORAL
  Filled 2021-10-28: qty 1

## 2021-10-28 MED ORDER — POVIDONE-IODINE 10 % EX SWAB: 2.0000 "application " | Freq: Once | CUTANEOUS | Status: DC

## 2021-10-28 MED ORDER — LACTATED RINGERS IV SOLN
Freq: Once | INTRAVENOUS | Status: AC
Start: 2021-10-28 — End: 2021-10-29
  Administered 2021-10-29: 1000 mL via INTRAVENOUS

## 2021-10-28 MED ORDER — ORAL CARE MOUTH RINSE: 15.0000 mL | Freq: Once | OROMUCOSAL | Status: AC

## 2021-10-28 MED ORDER — CHLORHEXIDINE GLUCONATE 0.12 % MT SOLN
15.0000 mL | Freq: Once | OROMUCOSAL | Status: AC
  Administered 2021-10-29: 15 mL via OROMUCOSAL
  Filled 2021-10-28: qty 15

## 2021-10-28 MED ORDER — CEFAZOLIN SODIUM-DEXTROSE 2-4 GM/100ML-% IV SOLN
2.0000 g | INTRAVENOUS | Status: AC
  Administered 2021-10-29: 2 g via INTRAVENOUS
  Filled 2021-10-28: qty 100

## 2021-10-28 MED ORDER — LACTATED RINGERS IV SOLN: INTRAVENOUS | Status: DC

## 2021-10-28 NOTE — H&P (Signed)
Kimberly Holder is a 41 y.o. female [phone] at [redacted]w[redacted]d presenting for scheduled cesarean section. Patient with prenatal care at [facility] complicated by [facility], previous cesarean section x 2, and polyhydramnios. Patient reports good fetal movement and occasional contractions. She denies leakage of fluid or vaginal bleeding.  ? ?OB History   ? ? Gravida  ?5  ? Para  ?2  ? Term  ?2  ? Preterm  ?   ? AB  ?2  ? Living  ?2  ?  ? ? SAB  ?1  ? IAB  ?1  ? Ectopic  ?   ? Multiple  ?   ? Live Births  ?2  ?   ?  ? Obstetric Comments  ?G3 PIH ?G4 Preeclampsia  ?  ? ?  ? ?Past Medical History:  ?Diagnosis Date  ? Atypical squamous cells of undetermined significance (ASCUS) on Papanicolaou smear of cervix   ? Cervical high risk HPV (human papillomavirus) test positive 2014  ? COVID-19 06/23/2019  ? x 2 from 2020 to 2022  ? Obesity (BMI 30-39.9)   ? PCOS (polycystic ovarian syndrome)   ? Preeclampsia   ? during pregnancy  ? ?Past Surgical History:  ?Procedure Laterality Date  ? CESAREAN SECTION  01/26/2006  ? CESAREAN SECTION  08/10/2011  ? COLPOSCOPY W/ BIOPSY / CURETTAGE  08/01/2014  ? BX Neg  ? TONSILLECTOMY AND ADENOIDECTOMY  1996  ? ?Family History: family history includes Arthritis in her brother and mother; Cancer (age of onset: 46) in her mother; Diabetes in her maternal grandmother and mother; Endocrine tumor in her mother; Heart disease in her maternal grandmother; Hypertension in her father; Other in her father. ?Social History:  reports that she has never smoked. She has never used smokeless tobacco. She reports that she does not drink alcohol and does not use drugs. ? ? ?  ?Maternal Diabetes: No ?Genetic Screening: Normal ?Maternal Ultrasounds/Referrals: Normal ?Fetal Ultrasounds or other Referrals:  None ?Maternal Substance Abuse:  No ?Significant Maternal Medications:  None ?Significant Maternal Lab Results:  Group B Strep negative ?Other Comments:  None ? ?Review of Systems ?History ? See pertinent in HPI. All  other systems reviewed and non contributory ?Blood pressure 130/78, pulse 91, temperature 98.2 ?F (36.8 ?C), temperature source Oral, resp. rate 18, height 5\' 1"  (1.549 m), weight 95 kg, last menstrual period 01/28/2021. ?Exam ?Physical Exam  ?GENERAL: Well-developed, well-nourished female in no acute distress.  ?LUNGS: Clear to auscultation bilaterally.  ?HEART: Regular rate and rhythm. ?ABDOMEN: Soft, nontender, gravid. No organomegaly. ?PELVIC: Not indicated ?EXTREMITIES: No cyanosis, clubbing, or edema, 2+ distal pulses. ? ?Prenatal labs: ?ABO, Rh: --/--/PENDING (04/28 1051) ?Antibody: NEG (04/26 05-04-1990) ?Rubella: 1.44 (11/22 1112) ?RPR: NON REACTIVE (04/26 05-04-1990)  ?HBsAg: Negative (11/22 1112)  ?HIV: Non Reactive (01/31 0924)  ?GBS: Negative/-- (04/12 0934)  ? ?Assessment/Plan: ?41 yo 45 at [redacted]w[redacted]d here for scheduled repeat cesarean section ?- Risks, benefits and alternatives were explained including but not limited to risks of bleeding, infection and damage to adjacent  organs. Patient verbalized understand and all questions were answered ?- Patient also desires permanent sterilization. Risks and benefits of procedure discussed with patient including permanence of method, bleeding, infection, injury to surrounding organs and need for additional procedures. Risk failure of 0.5-1% with increased risk of ectopic gestation if pregnancy occurs was also discussed with patient. ? ?  ? ?Kimberly Holder ?10/29/2021, 12:05 PM ? ? ? ? ?

## 2021-10-29 ENCOUNTER — Encounter: Payer: Self-pay | Admitting: Obstetrics and Gynecology

## 2021-10-29 ENCOUNTER — Inpatient Hospital Stay: Payer: 59 | Admitting: Urgent Care

## 2021-10-29 ENCOUNTER — Inpatient Hospital Stay
Admission: RE | Admit: 2021-10-29 | Discharge: 2021-10-31 | DRG: 785 | Disposition: A | Discharge status: Home / Self Care | Payer: 59 | Attending: Obstetrics and Gynecology | Admitting: Obstetrics and Gynecology

## 2021-10-29 ENCOUNTER — Other Ambulatory Visit: Payer: Self-pay

## 2021-10-29 ENCOUNTER — Encounter
Admission: RE | Disposition: A | Discharge status: Home / Self Care | Payer: Self-pay | Source: Home / Self Care | Attending: Obstetrics and Gynecology

## 2021-10-29 DIAGNOSIS — O34211 Maternal care for low transverse scar from previous cesarean delivery: Principal | ICD-10-CM

## 2021-10-29 DIAGNOSIS — Z01812 Encounter for preprocedural laboratory examination: Principal | ICD-10-CM

## 2021-10-29 DIAGNOSIS — Z23 Encounter for immunization: Secondary | ICD-10-CM | POA: Diagnosis not present

## 2021-10-29 DIAGNOSIS — Z8616 Personal history of COVID-19: Secondary | ICD-10-CM | POA: Diagnosis not present

## 2021-10-29 DIAGNOSIS — O9081 Anemia of the puerperium: Secondary | ICD-10-CM | POA: Diagnosis not present

## 2021-10-29 DIAGNOSIS — Z302 Encounter for sterilization: Secondary | ICD-10-CM | POA: Diagnosis not present

## 2021-10-29 DIAGNOSIS — O403XX0 Polyhydramnios, third trimester, not applicable or unspecified: Secondary | ICD-10-CM | POA: Diagnosis present

## 2021-10-29 DIAGNOSIS — O99214 Obesity complicating childbirth: Secondary | ICD-10-CM | POA: Diagnosis present

## 2021-10-29 DIAGNOSIS — O0990 Supervision of high risk pregnancy, unspecified, unspecified trimester: Secondary | ICD-10-CM

## 2021-10-29 DIAGNOSIS — O3660X0 Maternal care for excessive fetal growth, unspecified trimester, not applicable or unspecified: Secondary | ICD-10-CM

## 2021-10-29 DIAGNOSIS — Z3A39 39 weeks gestation of pregnancy: Secondary | ICD-10-CM

## 2021-10-29 DIAGNOSIS — O09523 Supervision of elderly multigravida, third trimester: Secondary | ICD-10-CM

## 2021-10-29 DIAGNOSIS — Z98891 History of uterine scar from previous surgery: Secondary | ICD-10-CM

## 2021-10-29 DIAGNOSIS — O09299 Supervision of pregnancy with other poor reproductive or obstetric history, unspecified trimester: Secondary | ICD-10-CM

## 2021-10-29 HISTORY — DX: History of uterine scar from previous surgery: Z98.891

## 2021-10-29 LAB — CBC
HCT: 42.3 % (ref 36.0–46.0)
Hemoglobin: 14.4 g/dL (ref 12.0–15.0)
MCH: 30.3 pg (ref 26.0–34.0)
MCHC: 34 g/dL (ref 30.0–36.0)
MCV: 89.1 fL (ref 80.0–100.0)
Platelets: 162 10*3/uL (ref 150–400)
RBC: 4.75 MIL/uL (ref 3.87–5.11)
RDW: 14.6 % (ref 11.5–15.5)
WBC: 8.9 10*3/uL (ref 4.0–10.5)
nRBC: 0 % (ref 0.0–0.2)

## 2021-10-29 LAB — ABO/RH: ABO/RH(D): A POS

## 2021-10-29 LAB — CREATININE, SERUM
Creatinine, Ser: 0.53 mg/dL (ref 0.44–1.00)
GFR, Estimated: >60 mL/min (ref >60)

## 2021-10-29 SURGERY — Surgical Case
Anesthesia: Spinal | Laterality: Bilateral

## 2021-10-29 MED ORDER — OXYCODONE HCL 5 MG PO TABS: 5.0000 mg | ORAL_TABLET | Freq: Once | ORAL | Status: DC | PRN

## 2021-10-29 MED ORDER — OXYTOCIN-SODIUM CHLORIDE 30-0.9 UT/500ML-% IV SOLN
INTRAVENOUS | Status: AC
  Administered 2021-10-29: 2.5 [IU]/h via INTRAVENOUS
  Filled 2021-10-29: qty 1000

## 2021-10-29 MED ORDER — WITCH HAZEL-GLYCERIN EX PADS: 1.0000 "application " | MEDICATED_PAD | CUTANEOUS | Status: DC | PRN

## 2021-10-29 MED ORDER — FENTANYL CITRATE (PF) 100 MCG/2ML IJ SOLN
INTRAMUSCULAR | Status: DC | PRN
Start: 2021-10-29 — End: 2021-10-29
  Administered 2021-10-29: 15 ug via INTRATHECAL

## 2021-10-29 MED ORDER — PROPOFOL 10 MG/ML IV BOLUS
INTRAVENOUS | Status: AC
  Filled 2021-10-29: qty 20

## 2021-10-29 MED ORDER — DIPHENHYDRAMINE HCL 25 MG PO CAPS: 25.0000 mg | ORAL_CAPSULE | ORAL | Status: DC | PRN

## 2021-10-29 MED ORDER — SOD CITRATE-CITRIC ACID 500-334 MG/5ML PO SOLN
ORAL | Status: AC
  Administered 2021-10-29: 30 mL
  Filled 2021-10-29: qty 15

## 2021-10-29 MED ORDER — IBUPROFEN 600 MG PO TABS
600.0000 mg | ORAL_TABLET | Freq: Four times a day (QID) | ORAL | Status: DC
  Administered 2021-10-30 – 2021-10-31 (×4): 600 mg via ORAL
  Filled 2021-10-29 (×4): qty 1

## 2021-10-29 MED ORDER — LIDOCAINE HCL (PF) 1 % IJ SOLN
INTRAMUSCULAR | Status: DC | PRN
  Administered 2021-10-29: 3 mL

## 2021-10-29 MED ORDER — MORPHINE SULFATE (PF) 0.5 MG/ML IJ SOLN
INTRAMUSCULAR | Status: DC | PRN
  Administered 2021-10-29: 100 ug via EPIDURAL

## 2021-10-29 MED ORDER — SODIUM CHLORIDE 0.9% FLUSH: 3.0000 mL | INTRAVENOUS | Status: DC | PRN

## 2021-10-29 MED ORDER — OXYCODONE HCL 5 MG PO TABS: 5.0000 mg | ORAL_TABLET | ORAL | Status: AC | PRN

## 2021-10-29 MED ORDER — OXYCODONE HCL 5 MG/5ML PO SOLN
5.0000 mg | Freq: Once | ORAL | Status: DC | PRN
  Filled 2021-10-29: qty 5

## 2021-10-29 MED ORDER — NALOXONE HCL 4 MG/10ML IJ SOLN
1.0000 ug/kg/h | INTRAVENOUS | Status: DC | PRN
  Filled 2021-10-29: qty 5

## 2021-10-29 MED ORDER — FENTANYL CITRATE (PF) 100 MCG/2ML IJ SOLN
INTRAMUSCULAR | Status: AC
  Filled 2021-10-29: qty 2

## 2021-10-29 MED ORDER — PRENATAL MULTIVITAMIN CH
1.0000 | ORAL_TABLET | Freq: Every day | ORAL | Status: DC
  Administered 2021-10-30: 1 via ORAL
  Filled 2021-10-29: qty 1

## 2021-10-29 MED ORDER — FENTANYL CITRATE (PF) 100 MCG/2ML IJ SOLN: 25.0000 ug | INTRAMUSCULAR | Status: DC | PRN

## 2021-10-29 MED ORDER — OXYCODONE-ACETAMINOPHEN 5-325 MG PO TABS
1.0000 | ORAL_TABLET | ORAL | Status: DC | PRN
  Administered 2021-10-30: 2 via ORAL
  Administered 2021-10-31: 1 via ORAL
  Filled 2021-10-29: qty 1
  Filled 2021-10-29: qty 2

## 2021-10-29 MED ORDER — MENTHOL 3 MG MT LOZG
1.0000 | LOZENGE | OROMUCOSAL | Status: DC | PRN
  Filled 2021-10-29: qty 9

## 2021-10-29 MED ORDER — SIMETHICONE 80 MG PO CHEW
80.0000 mg | CHEWABLE_TABLET | Freq: Three times a day (TID) | ORAL | Status: DC
  Administered 2021-10-29 – 2021-10-31 (×5): 80 mg via ORAL
  Filled 2021-10-29 (×5): qty 1

## 2021-10-29 MED ORDER — PHENYLEPHRINE 80 MCG/ML (10ML) SYRINGE FOR IV PUSH (FOR BLOOD PRESSURE SUPPORT)
PREFILLED_SYRINGE | INTRAVENOUS | Status: DC | PRN
Start: 2021-10-29 — End: 2021-10-29
  Administered 2021-10-29 (×7): 80 ug via INTRAVENOUS

## 2021-10-29 MED ORDER — COCONUT OIL OIL: 1.0000 "application " | TOPICAL_OIL | Status: DC | PRN

## 2021-10-29 MED ORDER — BUPIVACAINE HCL (PF) 0.5 % IJ SOLN
INTRAMUSCULAR | Status: AC
  Filled 2021-10-29: qty 10

## 2021-10-29 MED ORDER — DIBUCAINE (PERIANAL) 1 % EX OINT: 1.0000 "application " | TOPICAL_OINTMENT | CUTANEOUS | Status: DC | PRN

## 2021-10-29 MED ORDER — PHENYLEPHRINE HCL (PRESSORS) 10 MG/ML IV SOLN
INTRAVENOUS | Status: AC
  Filled 2021-10-29: qty 1

## 2021-10-29 MED ORDER — SENNOSIDES-DOCUSATE SODIUM 8.6-50 MG PO TABS
2.0000 | ORAL_TABLET | Freq: Every day | ORAL | Status: DC
  Administered 2021-10-30 – 2021-10-31 (×2): 2 via ORAL
  Filled 2021-10-29 (×2): qty 2

## 2021-10-29 MED ORDER — OXYTOCIN-SODIUM CHLORIDE 30-0.9 UT/500ML-% IV SOLN: 2.5000 [IU]/h | INTRAVENOUS | Status: AC

## 2021-10-29 MED ORDER — PHENYLEPHRINE HCL-NACL 20-0.9 MG/250ML-% IV SOLN
INTRAVENOUS | Status: DC | PRN
  Administered 2021-10-29: 40 ug/min via INTRAVENOUS

## 2021-10-29 MED ORDER — BUPIVACAINE 0.25 % ON-Q PUMP DUAL CATH 400 ML
400.0000 mL | INJECTION | Status: DC
  Filled 2021-10-29: qty 400

## 2021-10-29 MED ORDER — OXYTOCIN-SODIUM CHLORIDE 30-0.9 UT/500ML-% IV SOLN
INTRAVENOUS | Status: DC | PRN
  Administered 2021-10-29: 500 mL via INTRAVENOUS

## 2021-10-29 MED ORDER — SIMETHICONE 80 MG PO CHEW: 80.0000 mg | CHEWABLE_TABLET | ORAL | Status: DC | PRN

## 2021-10-29 MED ORDER — ONDANSETRON HCL 4 MG/2ML IJ SOLN
INTRAMUSCULAR | Status: DC | PRN
  Administered 2021-10-29: 4 mg via INTRAVENOUS

## 2021-10-29 MED ORDER — BUPIVACAINE IN DEXTROSE 0.75-8.25 % IT SOLN
INTRATHECAL | Status: DC | PRN
  Administered 2021-10-29: 1.5 mL via INTRATHECAL

## 2021-10-29 MED ORDER — BUPIVACAINE 0.25 % ON-Q PUMP DUAL CATH 300 ML
300.0000 mL | INJECTION | Status: DC
  Filled 2021-10-29: qty 300

## 2021-10-29 MED ORDER — ENOXAPARIN SODIUM 40 MG/0.4ML IJ SOSY
40.0000 mg | PREFILLED_SYRINGE | INTRAMUSCULAR | Status: DC
  Administered 2021-10-30 – 2021-10-31 (×2): 40 mg via SUBCUTANEOUS
  Filled 2021-10-29 (×3): qty 0.4

## 2021-10-29 MED ORDER — GLYCOPYRROLATE 0.2 MG/ML IJ SOLN
INTRAMUSCULAR | Status: DC | PRN
  Administered 2021-10-29: .2 mg via INTRAVENOUS

## 2021-10-29 MED ORDER — DIPHENHYDRAMINE HCL 50 MG/ML IJ SOLN: 12.5000 mg | INTRAMUSCULAR | Status: DC | PRN

## 2021-10-29 MED ORDER — DIPHENHYDRAMINE HCL 25 MG PO CAPS: 25.0000 mg | ORAL_CAPSULE | Freq: Four times a day (QID) | ORAL | Status: DC | PRN

## 2021-10-29 MED ORDER — MORPHINE SULFATE (PF) 0.5 MG/ML IJ SOLN
INTRAMUSCULAR | Status: AC
  Filled 2021-10-29: qty 10

## 2021-10-29 MED ORDER — NALOXONE HCL 0.4 MG/ML IJ SOLN: 0.4000 mg | INTRAMUSCULAR | Status: DC | PRN

## 2021-10-29 MED ORDER — TETANUS-DIPHTH-ACELL PERTUSSIS 5-2.5-18.5 LF-MCG/0.5 IM SUSY
0.5000 mL | PREFILLED_SYRINGE | Freq: Once | INTRAMUSCULAR | Status: AC
Start: 2021-10-30 — End: 2021-10-31
  Administered 2021-10-31: 0.5 mL via INTRAMUSCULAR
  Filled 2021-10-29: qty 0.5

## 2021-10-29 MED ORDER — KETOROLAC TROMETHAMINE 30 MG/ML IJ SOLN: 30.0000 mg | Freq: Four times a day (QID) | INTRAMUSCULAR | Status: AC

## 2021-10-29 MED ORDER — KETOROLAC TROMETHAMINE 30 MG/ML IJ SOLN
30.0000 mg | Freq: Four times a day (QID) | INTRAMUSCULAR | Status: AC
  Administered 2021-10-29 – 2021-10-30 (×3): 30 mg via INTRAVENOUS
  Filled 2021-10-29 (×3): qty 1

## 2021-10-29 MED ORDER — ACETAMINOPHEN 325 MG PO TABS
650.0000 mg | ORAL_TABLET | Freq: Four times a day (QID) | ORAL | Status: AC
Start: 2021-10-29 — End: 2021-10-30
  Administered 2021-10-29 – 2021-10-30 (×4): 650 mg via ORAL
  Filled 2021-10-29 (×4): qty 2

## 2021-10-29 MED ORDER — ONDANSETRON HCL 4 MG/2ML IJ SOLN: 4.0000 mg | Freq: Three times a day (TID) | INTRAMUSCULAR | Status: DC | PRN

## 2021-10-29 SURGICAL SUPPLY — 30 items
CELL SAVER LIPIGURD (MISCELLANEOUS) ×1 IMPLANT
DEVICE RETRIEVAL ALEXIS 14 (MISCELLANEOUS) IMPLANT
DRAPE WARM FLUID 44X44 (DRAPES) IMPLANT
DRSG OPSITE POSTOP 4X10 (GAUZE/BANDAGES/DRESSINGS) ×2 IMPLANT
ELECT REM PT RETURN 9FT ADLT (ELECTROSURGICAL) ×2
ELECTRODE REM PT RTRN 9FT ADLT (ELECTROSURGICAL) ×1 IMPLANT
EXTRT SYSTEM ALEXIS 14CM (MISCELLANEOUS) ×2
GLOVE BIOGEL PI IND STRL 6.5 (GLOVE) ×1 IMPLANT
GLOVE BIOGEL PI IND STRL 7.0 (GLOVE) ×1 IMPLANT
GLOVE BIOGEL PI INDICATOR 6.5 (GLOVE) ×1
GLOVE BIOGEL PI INDICATOR 7.0 (GLOVE) ×1
GOWN STRL REUS W/ TWL LRG LVL3 (GOWN DISPOSABLE) ×2 IMPLANT
GOWN STRL REUS W/TWL LRG LVL3 (GOWN DISPOSABLE) ×2
MANIFOLD NEPTUNE II (INSTRUMENTS) ×4 IMPLANT
MAT PREVALON FULL STRYKER (MISCELLANEOUS) ×2 IMPLANT
NS IRRIG 1000ML POUR BTL (IV SOLUTION) ×2 IMPLANT
PACK C SECTION AR (MISCELLANEOUS) ×2 IMPLANT
PAD OB MATERNITY 4.3X12.25 (PERSONAL CARE ITEMS) ×2 IMPLANT
RTRCTR C-SECT PINK 25CM LRG (MISCELLANEOUS) IMPLANT
SEPRAFILM MEMBRANE 5X6 (MISCELLANEOUS) IMPLANT
SOL PREP PVP 2OZ (MISCELLANEOUS) ×2
SOLUTION PREP PVP 2OZ (MISCELLANEOUS) ×1 IMPLANT
SPONGE T-LAP 18X18 ~~LOC~~+RFID (SPONGE) ×2 IMPLANT
SUT PLAIN GUT 0 (SUTURE) IMPLANT
SUT VIC AB 0 CT1 36 (SUTURE) ×8 IMPLANT
SUT VIC AB 4-0 KS 27 (SUTURE) ×2 IMPLANT
SUT VICRYL 0 27 CT2 27 ABS (SUTURE) ×1 IMPLANT
TOWEL OR 17X26 4PK STRL BLUE (TOWEL DISPOSABLE) ×2 IMPLANT
TRAY FOLEY MTR SLVR 16FR STAT (SET/KITS/TRAYS/PACK) ×2 IMPLANT
WATER STERILE IRR 500ML POUR (IV SOLUTION) ×2 IMPLANT

## 2021-10-29 NOTE — Lactation Note (Addendum)
This note was copied from a baby's chart. ?Lactation Consultation Note ? ?Patient Name: Kimberly Holder ?Today's Date: 10/29/2021 ?Reason for consult: L&D Initial assessment;1st time breastfeeding;Term ?Age:42 hours ? ?Maternal Data ?Does the patient have breastfeeding experience prior to this delivery?: Yes ?How long did the patient breastfeed?: 4 mths ? ?Feeding ?Mother's Current Feeding Choice: Breast Milk ?BAby was nursing on right breast in cradle hold, when ldr 6 entered, deep latch observed and occ swallow noted, reviewed breastfeeding basics with mom as her last child is 51 yrs old.   ?LATCH Score ?Latch: Grasps breast easily, tongue down, lips flanged, rhythmical sucking. ? ?Audible Swallowing: A few with stimulation ? ?Type of Nipple: Everted at rest and after stimulation ? ?Comfort (Breast/Nipple): Soft / non-tender ? ?Hold (Positioning): Assistance needed to correctly position infant at breast and maintain latch. ? ?LATCH Score: 8 ? ? ?Lactation Tools Discussed/Used ? [place] name and no written on white board in LDR 6 ? ?Interventions ?Interventions: Breast feeding basics reviewed;Skin to skin;Support pillows;Education ? ?Discharge ?[place] Program: Yes ? ?Consult Status ?Consult Status: PRN ? ? ? ?Ferol Luz ?10/29/2021, 2:41 PM ? ? ? ?

## 2021-10-29 NOTE — Op Note (Addendum)
Kimberly Holder ?PROCEDURE DATE: 10/29/2021 ? ?PREOPERATIVE DIAGNOSIS: Intrauterine pregnancy at  [redacted]w[redacted]d weeks gestation; previous uterine incision kerr x2 and desire for permanent sterilization ? ?POSTOPERATIVE DIAGNOSIS: The same ? ?PROCEDURE:     Cesarean Section and bilateral salpingectomy ? ?SURGEON:  Dr. Catalina Antigua ? ?ASSISTANT: Dr. Logan Bores ? ?INDICATIONS: Kimberly Holder is a 41 y.o. [id number] at [redacted]w[redacted]d scheduled for cesarean section secondary to previous uterine incision kerr x2.  The risks of cesarean section discussed with the patient included but were not limited to: bleeding which may require transfusion or reoperation; infection which may require antibiotics; injury to bowel, bladder, ureters or other surrounding organs; injury to the fetus; need for additional procedures including hysterectomy in the event of a life-threatening hemorrhage; placental abnormalities wth subsequent pregnancies, incisional problems, thromboembolic phenomenon and other postoperative/anesthesia complications. The patient concurred with the proposed plan, giving informed written consent for the procedure.   ? ?FINDINGS:  Viable female infant in cephalic presentation.  Apgars 9 and 9, weight, 9 pounds and 8.7 ounces.  Clear amniotic fluid.  Intact placenta, three vessel cord.  Normal uterus, fallopian tubes and ovaries bilaterally. ? ?An experienced assistant was required given the standard of surgical care given the complexity of the case.  This assistant was needed for exposure, dissection, suctioning, retraction, instrument exchange, assisting with delivery with administration of fundal pressure, and for overall help during the procedure. ? ? ?ANESTHESIA:    Spinal ?INTRAVENOUS FLUIDS:800 ml ?ESTIMATED BLOOD LOSS: 230 mL ml ?URINE OUTPUT:  60 ml ?SPECIMENS: Placenta sent to L&D ?COMPLICATIONS: None immediate ? ?PROCEDURE IN DETAIL:  The patient received intravenous antibiotics and had sequential compression devices applied to her  lower extremities while in the preoperative area.  She was then taken to the operating room where anesthesia was induced and was found to be adequate. A foley catheter was placed into her bladder and attached to Keierra Nudo gravity. She was then placed in a dorsal supine position with a leftward tilt, and prepped and draped in a sterile manner. After an adequate timeout was performed, a Pfannenstiel skin incision was made with scalpel and carried through to the underlying layer of fascia. The fascia was incised in the midline and this incision was extended bilaterally using the Mayo scissors. Kocher clamps were applied to the superior aspect of the fascial incision and the underlying rectus muscles were dissected off bluntly. A similar process was carried out on the inferior aspect of the facial incision. The rectus muscles were separated in the midline bluntly and the peritoneum was entered bluntly. The Alexis self-retaining retractor was introduced into the abdominal cavity. Attention was turned to the lower uterine segment where a bladder flap was created, and a transverse hysterotomy was made with a scalpel and extended bilaterally bluntly. The infant was successfully delivered and delayed cord clamping was performed for 1 minute before the cord was clamped and cut and infant was handed over to awaiting neonatology team. Uterine massage was then administered and the placenta delivered intact with three-vessel cord. The uterus was exteriorized and cleared of clot and debris.  The hysterotomy was closed with 0 Vicryl in a running locked fashion, and an imbricating layer was also placed with a 0 Vicryl. Overall, excellent hemostasis was noted. The pelvis was cleared of all clot and debris. The patient's left fallopian tube was then identified, and grasped with a Babcock clamp. The tube was then followed out to the fimbria. The Babcock clamp was then used to grasp the tube  approximately 4 cm from the cornual region. A  segment of the tube including the fimbriated end was grasped with a Kelly clamp and then ligated with free tie of plain gut suture, transected and excised. Good hemostasis was noted.  The right fallopian tube was then identified to its fimbriated end, ligated, and a segment excised in a similar fashion. Excellent hemostasis was noted. The uterus was returned to the abdomen. Hemostasis was confirmed on all surfaces.  The peritoneum and the muscles were reapproximated using 0 vicryl interrupted stitches. The On-Q pump catheter were placed below the fascia per standard technique. The fascia was then closed using 0 Vicryl in a running fashion.  The skin was closed in a subcuticular fashion using 3.0 Vicryl. The patient tolerated the procedure well. Sponge, lap, instrument and needle counts were correct x 2. She was taken to the recovery room in stable condition.  ? ? ?Kimberly Holder  ?10/29/2021 1:46 PM ?

## 2021-10-29 NOTE — Anesthesia Preprocedure Evaluation (Signed)
Anesthesia Evaluation  ?Patient identified by MRN, date of birth, ID band ?Patient awake ? ? ? ?Reviewed: ?Allergy & Precautions, NPO status , Patient's Chart, lab work & pertinent test results ? ?History of Anesthesia Complications ?Negative for: history of anesthetic complications ? ?Airway ?Mallampati: III ? ?TM Distance: <3 FB ?Neck ROM: full ? ? ? Dental ? ?(+) Chipped ?  ?Pulmonary ?neg pulmonary ROS, neg shortness of breath,  ?  ?Pulmonary exam normal ? ? ? ? ? ? ? Cardiovascular ?Exercise Tolerance: Good ?(-) hypertension(-) angina(-) Past MI and (-) DOE Normal cardiovascular exam ? ? ?  ?Neuro/Psych ?  ? GI/Hepatic ?negative GI ROS, neg GERD  ,  ?Endo/Other  ? ? Renal/GU ?  ?negative genitourinary ?  ?Musculoskeletal ? ? Abdominal ?  ?Peds ? Hematology ?negative hematology ROS ?(+)   ?Anesthesia Other Findings ?Past Medical History: ?No date: Atypical squamous cells of undetermined significance (ASCUS)  ?on Papanicolaou smear of cervix ?2014: Cervical high risk HPV (human papillomavirus) test positive ?06/23/2019: COVID-19 ?    Comment:  x 2 from 2020 to 2022 ?No date: Obesity (BMI 30-39.9) ?No date: PCOS (polycystic ovarian syndrome) ?No date: Preeclampsia ?    Comment:  during pregnancy ? ?Past Surgical History: ?01/26/2006: CESAREAN SECTION ?08/10/2011: CESAREAN SECTION ?08/01/2014: COLPOSCOPY W/ BIOPSY / CURETTAGE ?    Comment:  BX Neg ?1996: TONSILLECTOMY AND ADENOIDECTOMY ? ?BMI   ? Body Mass Index: 39.57 kg/m?  ?  ? ? Reproductive/Obstetrics ?(+) Pregnancy ? ?  ? ? ? ? ? ? ? ? ? ? ? ? ? ?  ?  ? ? ? ? ? ? ? ? ?Anesthesia Physical ?Anesthesia Plan ? ?ASA: 3 ? ?Anesthesia Plan: Spinal  ? ?Post-op Pain Management:   ? ?Induction:  ? ?PONV Risk Score and Plan:  ? ?Airway Management Planned: Natural Airway and Nasal Cannula ? ?Additional Equipment:  ? ?Intra-op Plan:  ? ?Post-operative Plan:  ? ?Informed Consent: I have reviewed the patients History and Physical, chart,  labs and discussed the procedure including the risks, benefits and alternatives for the proposed anesthesia with the patient or authorized representative who has indicated his/her understanding and acceptance.  ? ? ? ?Dental Advisory Given ? ?Plan Discussed with: Anesthesiologist, CRNA and Surgeon ? ?Anesthesia Plan Comments: (Patient reports no bleeding problems and no anticoagulant use. ? ?Plan for spinal with backup GA ? ?Patient consented for risks of anesthesia including but not limited to:  ?- adverse reactions to medications ?- damage to eyes, teeth, lips or other oral mucosa ?- nerve damage due to positioning  ?- risk of bleeding, infection and or nerve damage from spinal that could lead to paralysis ?- risk of headache or failed spinal ?- damage to teeth, lips or other oral mucosa ?- sore throat or hoarseness ?- damage to heart, brain, nerves, lungs, other parts of body or loss of life ? ?Patient voiced understanding.)  ? ? ? ? ? ? ?Anesthesia Quick Evaluation ? ?

## 2021-10-29 NOTE — Transfer of Care (Signed)
Immediate Anesthesia Transfer of Care Note ? ?Patient: Kimberly Holder ? ?Procedure(s) Performed: CESAREAN SECTION WITH BILATERAL TUBAL LIGATION (Bilateral) ? ?Patient Location: Mother/Baby ? ?Anesthesia Type:Spinal ? ?Level of Consciousness: awake, alert  and oriented ? ?Airway & Oxygen Therapy: Patient Spontanous Breathing ? ?Post-op Assessment: Report given to RN and Post -op Vital signs reviewed and stable ? ?Post vital signs: Reviewed and stable ? ?Last Vitals:  ?Vitals Value Taken Time  ?BP 115/61   ?Temp 36.4 ?C 10/29/21 1403  ?Pulse 79   ?Resp 12   ?SpO2 96   ? ? ?Last Pain:  ?Vitals:  ? 10/29/21 1403  ?TempSrc: Oral  ?PainSc:   ?   ? ?  ? ?Complications: No notable events documented. ?

## 2021-10-29 NOTE — Anesthesia Procedure Notes (Signed)
Spinal ? ?Patient location during procedure: OR ?Start time: 10/29/2021 12:51 PM ?End time: 10/29/2021 3:53 PM ?Reason for block: surgical anesthesia ?Staffing ?Performed: resident/CRNA  ?Anesthesiologist: Piscitello, Cleda Mccreedy, MD ?Resident/CRNA: Jeanine Luz, CRNA ?Preanesthetic Checklist ?Completed: patient identified, IV checked, site marked, risks and benefits discussed, surgical consent, monitors and equipment checked, pre-op evaluation and timeout performed ?Spinal Block ?Patient position: sitting ?Prep: DuraPrep ?Patient monitoring: heart rate, continuous pulse ox and blood pressure ?Approach: midline ?Location: L3-4 ?Injection technique: single-shot ?Needle ?Needle type: Pencan  ?Needle gauge: 24 G ?Needle length: 10 cm ?Assessment ?Events: CSF return ? ? ? ?

## 2021-10-29 NOTE — Anesthesia Procedure Notes (Signed)
Date/Time: 10/29/2021 1:02 PM ?Performed by: Joanette Gula, Seena Ritacco, CRNA ?Pre-anesthesia Checklist: Patient identified, Emergency Drugs available, Suction available, Patient being monitored and Timeout performed ?Patient Re-evaluated:Patient Re-evaluated prior to induction ?Oxygen Delivery Method: Nasal cannula ?Induction Type: IV induction ? ? ? ? ?

## 2021-10-30 LAB — CBC
HCT: 35.9 % — ABNORMAL LOW (ref 36.0–46.0)
Hemoglobin: 12.2 g/dL (ref 12.0–15.0)
MCH: 30.8 pg (ref 26.0–34.0)
MCHC: 34 g/dL (ref 30.0–36.0)
MCV: 90.7 fL (ref 80.0–100.0)
Platelets: 127 10*3/uL — ABNORMAL LOW (ref 150–400)
RBC: 3.96 MIL/uL (ref 3.87–5.11)
RDW: 14.7 % (ref 11.5–15.5)
WBC: 10.2 10*3/uL (ref 4.0–10.5)
nRBC: 0 % (ref 0.0–0.2)

## 2021-10-30 NOTE — Progress Notes (Signed)
?  Subjective:  ?Doing well.  Ambulating in room. Has a good appetite.  Breastfeeding independently. Pain well managed.  ? ? ?Objective:  ?Blood pressure 107/60, pulse 70, temperature 98.1 ?F (36.7 ?C), temperature source Oral, resp. rate 17, height 5\' 1"  (1.549 m), weight 95 kg, last menstrual period 01/28/2021, SpO2 98 %, unknown if currently breastfeeding. ? ?General: NAD ?Pulmonary: no increased work of breathing ?Breasts: soft, no redness or masses, nipples erect and intact bilaterally.  ?Abdomen: non-distended, non-tender, fundus firm at level of umbilicus, bowel sounds present. ?Incision:  Dressing dry and intact, On Q pump intact ?Extremities: trace edema, no erythema, no tenderness ? ?Results for orders placed or performed during the hospital encounter of 10/29/21 (from the past 24 hour(s))  ?CBC     Status: None  ? Collection Time: 10/29/21 10:51 AM  ?Result Value Ref Range  ? WBC 8.9 4.0 - 10.5 K/uL  ? RBC 4.75 3.87 - 5.11 MIL/uL  ? Hemoglobin 14.4 12.0 - 15.0 g/dL  ? HCT 42.3 36.0 - 46.0 %  ? MCV 89.1 80.0 - 100.0 fL  ? MCH 30.3 26.0 - 34.0 pg  ? MCHC 34.0 30.0 - 36.0 g/dL  ? RDW 14.6 11.5 - 15.5 %  ? Platelets 162 150 - 400 K/uL  ? nRBC 0.0 0.0 - 0.2 %  ?ABO/Rh     Status: None  ? Collection Time: 10/29/21 10:51 AM  ?Result Value Ref Range  ? ABO/RH(D)    ?  A POS ?Performed at [facility], [address], [place], [place] ?  ?Creatinine, serum     Status: None  ? Collection Time: 10/29/21 10:51 AM  ?Result Value Ref Range  ? Creatinine, Ser 0.53 0.44 - 1.00 mg/dL  ? GFR, Estimated >60 >60 mL/min  ?CBC     Status: Abnormal  ? Collection Time: 10/30/21  5:36 AM  ?Result Value Ref Range  ? WBC 10.2 4.0 - 10.5 K/uL  ? RBC 3.96 3.87 - 5.11 MIL/uL  ? Hemoglobin 12.2 12.0 - 15.0 g/dL  ? HCT 35.9 (L) 36.0 - 46.0 %  ? MCV 90.7 80.0 - 100.0 fL  ? MCH 30.8 26.0 - 34.0 pg  ? MCHC 34.0 30.0 - 36.0 g/dL  ? RDW 14.7 11.5 - 15.5 %  ? Platelets 127 (L) 150 - 400 K/uL  ? nRBC 0.0 0.0 - 0.2 %   ? ?@I /O24@ ? ? ?Assessment:  ? 41 y.o. 12-28-1983 postoperativeday # 1 ? ? ?Plan:  ?1) Acute blood loss anemia - hemodynamically stable and asymptomatic ?- po ferrous sulfate ? ?2) --/--/A POS ?Performed at [facility], [address] [place], [address] [place] ? (04/28 1051) / 1.44 (11/22 1112) / Varicella Immune ? ?3) TDAP status, needed  ? ?4) Breast, s/p BTL ? ?5) Disposition discharge 4/30  ?  ?

## 2021-10-31 ENCOUNTER — Encounter: Payer: Self-pay | Admitting: Obstetrics and Gynecology

## 2021-10-31 DIAGNOSIS — O09523 Supervision of elderly multigravida, third trimester: Secondary | ICD-10-CM

## 2021-10-31 DIAGNOSIS — Z3A39 39 weeks gestation of pregnancy: Secondary | ICD-10-CM

## 2021-10-31 DIAGNOSIS — O34211 Maternal care for low transverse scar from previous cesarean delivery: Principal | ICD-10-CM

## 2021-10-31 DIAGNOSIS — O403XX0 Polyhydramnios, third trimester, not applicable or unspecified: Secondary | ICD-10-CM

## 2021-10-31 MED ORDER — COCONUT OIL OIL: 1.0000 "application " | TOPICAL_OIL | 0 refills | Status: DC | PRN

## 2021-10-31 MED ORDER — OXYCODONE-ACETAMINOPHEN 5-325 MG PO TABS: 1.0000 | ORAL_TABLET | Freq: Four times a day (QID) | ORAL | 0 refills | Status: AC | PRN

## 2021-10-31 MED ORDER — SIMETHICONE 80 MG PO CHEW: 80.0000 mg | CHEWABLE_TABLET | ORAL | 0 refills | Status: DC | PRN

## 2021-10-31 MED ORDER — IBUPROFEN 600 MG PO TABS: 600.0000 mg | ORAL_TABLET | Freq: Four times a day (QID) | ORAL | 0 refills | Status: DC

## 2021-10-31 MED ORDER — SENNOSIDES-DOCUSATE SODIUM 8.6-50 MG PO TABS: 2.0000 | ORAL_TABLET | Freq: Every day | ORAL | Status: DC

## 2021-10-31 NOTE — Lactation Note (Signed)
This note was copied from a baby's chart. ?Lactation Consultation Note ? ?Patient Name: Kimberly Holder ?Today's Date: 10/31/2021 ?Reason for consult: Follow-up assessment;Term;Other (Comment) (LGA) ?Age:42 hours ? ?Lactation follow-up prior to discharge. ? ?Maternal Data ?Has patient been taught Hand Expression?: Yes ?Does the patient have breastfeeding experience prior to this delivery?: Yes ?How long did the patient breastfeed?: 7weeks, 4 months (7 weeks w/ first child, 4 months + night feedings with second) ? ?Mom's youngest child is 24 years old. Some breastfeeding experience that transitioned mainly to night feedings once baby was 94 months old. She notes a low milk supply with both other children. ? ?Feeding ?Mother's Current Feeding Choice: Breast Milk ? ?Baby has several documented feedings and void/stools since delivery. Mom notes no discomfort/pain with breastfeeding, reports audible swallows. Cluster feeding present over the last 2 nights; parents did offer 1 bottle of formula this morning in early AM. ? ?LATCH Score ? ?Lactation Tools Discussed/Used ?Tools: Bottle ? ?Interventions ?Interventions: Breast feeding basics reviewed;Hand express;DEBP;Education;Pace feeding (formula use/bottle feeding, building supply) ? ?LC and parents discussed normal newborn feeding patterns/behaviors, early hunger cues, cluster feeding/growth spurts, milk supply and demand and normal course of lactation. ?Tips given to aid in helping to boost supply: pumping post feeds 2-3x/day, and 1 time overnight. Encouraged hand expression pre/post pumping, spending time skin to skin with baby. ?Discussed supplementation options, bottle use, paced bottle feeding based on questions from parents. We discussed baby's size, age, tendency for sleeping at the breast, and other guidance to optimize breastfeeding goal. ? ?Discharge ?Discharge Education: Engorgement and breast care;Warning signs for feeding baby;Outpatient recommendation ?Pump:  Personal ? ?Anticipated breast changes and management provided along with nipple care. ? ?Consult Status ?Consult Status: Complete ? ?Outpatient lactation service information provided. Reviewed Enjoy booklet. ? ?Danford Bad ?10/31/2021, 10:50 AM ? ? ? ?

## 2021-10-31 NOTE — Discharge Instructions (Signed)
Please call office for appointment on Monday.  If any questions or concerns you may call the office or on call provider.   ?

## 2021-10-31 NOTE — Discharge Summary (Signed)
Obstetrical Discharge Summary ? ?Date of Admission: 10/29/2021 ?Date of Discharge: 10/31/2021 ? ?Primary OB: [facility] ? ?Gestational Age at Delivery: [redacted]w[redacted]d  ? ?Antepartum complications: polyhydramnios ?Reason for Admission: [place] ?Date of Delivery: 10/29/2021  ?Delivered By: Dr Mora Bellman ?Delivery Type: repeat cesarean section, low transverse incision ?Intrapartum complications/course: None ?Anesthesia: spinal ?Placenta: Delivered and expressed via active management. Intact: yes. To pathology: no.  ?Laceration: low transverse incision  ?Episiotomy: none or LCT uterus ?EBL: 230 ?Baby: Liveborn female, APGARs 9/9, weight 4330 g.  ?  ?Discharge Diagnosis: Delivered.  ? ?Postpartum course: Had an uneventful postpartum course.  Ambulating and voiding without difficulty. Pain well managed with occasional Percocet. Breastfeeding independently.  Infant fussy and cluster fed over night, parents gave 1oz supplement. Mood is good.  Ready to go home.  ?Discharge Vital Signs: ? Current Vital Signs 24h Vital Sign Ranges  ?T 98.1 ?F (36.7 ?C) Temp  Avg: 98.2 ?F (36.8 ?C)  Min: 98.1 ?F (36.7 ?C)  Max: 98.3 ?F (36.8 ?C)  ?BP 124/74 BP  Min: 99/66  Max: 125/67  ?HR 71 Pulse  Avg: 75  Min: 71  Max: 78  ?RR 14 Resp  Avg: 16.7  Min: 14  Max: 18  ?SaO2 99 % Room Air SpO2  Avg: 98 %  Min: 97 %  Max: 99 %  ?    ? 24 Hour I/O Current Shift I/O  ?Time ?Ins ?Outs 04/29 0701 - 04/30 0700 ?In: -  ?Out: 200 [Urine:200] No intake/output data recorded.  ? ? ?Patient Vitals for the past 6 hrs: ? BP Temp Temp src Pulse Resp SpO2  ?10/31/21 0830 124/74 98.1 ?F (36.7 ?C) Oral 71 14 99 %  ? ? ?Discharge Exam:  ?NAD ?Breasts: soft, no redness, nipples erect and intact bilaterally.  ?Abdomen: firm fundus below the umbilicus, NTTP, non distended, +bowel sounds.  Incision c/d/i with minimal drainage. On Q pump intact.  ?RRR no MRGs ?CTAB ?Ext: trace BLE, negative homan's  ? ?Recent Labs  ?Lab 10/29/21 ?1051 10/30/21 ?[id number]  ?WBC 8.9 10.2  ?HGB 14.4 12.2   ?HCT 42.3 35.9*  ?PLT 162 127*  ? ? ?Disposition: Home ? ?Rh Immune globulin given: no ?Rubella vaccine given: no ?Tdap vaccine given in AP or PP setting: no ?Flu vaccine given in AP or PP setting: no ? ?Contraception: bilateral tubal ligation ? 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 ?Plan:  ?Kimberly Holder was discharged to home in good condition. ?Follow-up appointment with PC (or any provider) in 1 and 6 week for a incision check then PP visit ? ?Future Appointments  ?Date Time Provider [place]  ?04/27/2022  8:40 AM McLean-Scocuzza, Nino Glow, MD [facility]  ? ? ?Discharge Medications: ?Allergies as of 10/31/2021   ?No Known Allergies ?  ? ?  ?Medication List  ?  ? ?STOP taking these medications   ? ?aspirin 81 MG EC tablet ?  ? ?  ? ?TAKE these medications   ? ?cholecalciferol 25 MCG (1000 UNIT) tablet ?Commonly known as: VITAMIN D3 ?Take 1,000 Units by mouth daily. ?  ?coconut oil Oil ?Apply 1 application. topically as needed. ?  ?ibuprofen 600 MG tablet ?Commonly known as: ADVIL ?Take 1 tablet (600 mg total) by mouth every 6 (six) hours. ?  ?oxyCODONE-acetaminophen 5-325 MG tablet ?Commonly  known as: PERCOCET/ROXICET ?Take 1-2 tablets by mouth every 6 (six) hours as needed for up to 3 days for moderate pain. ?  ?PRENATAL PO ?Take 1 tablet by mouth daily. ?  ?senna-docusate 8.6-50 MG tablet ?Commonly known as: Senokot-S ?Take 2 tablets by mouth daily. ?  ?simethicone 80 MG chewable tablet ?Commonly known as: MYLICON ?Chew 1 tablet (80 mg total) by mouth as needed for flatulence. ?  ? ?  ? ?  ?  ? ? ?  ?Discharge Care Instructions  ?(From admission, onward)  ?  ? ? ?  ? ?  Start     Ordered  ? 10/31/21 0000   Discharge wound care:       ?Comments: SHOWER DAILY ?Wash incision gently with soap and water.  ?Call office with any drainage, redness, or firmness of the incision.  ? 10/31/21 0917  ? ?  ?  ? ?  ? ?Roberto Scales, CNM  ?[place] Pigeon, [place]  ?@TODAY @  ?9:29 AM  ? ?

## 2021-10-31 NOTE — Plan of Care (Signed)
Discharge information reviewed with patient and father of baby.  Questions answered and printed copies given for discharge packet. ?

## 2021-11-01 ENCOUNTER — Telehealth: Payer: Self-pay

## 2021-11-01 NOTE — Anesthesia Postprocedure Evaluation (Signed)
Anesthesia Post Note ? ?Patient: Kimberly Holder ? ?Procedure(s) Performed: CESAREAN SECTION WITH BILATERAL TUBAL LIGATION (Bilateral) ? ?Patient location during evaluation: Mother Baby ?Anesthesia Type: Spinal ?Level of consciousness: oriented and awake and alert ?Pain management: pain level controlled ?Vital Signs Assessment: post-procedure vital signs reviewed and stable ?Respiratory status: spontaneous breathing ?Cardiovascular status: blood pressure returned to baseline and stable ?Postop Assessment: no headache, no backache, no apparent nausea or vomiting, patient able to bend at knees and able to ambulate ?Anesthetic complications: no ? ? ?No notable events documented. ? ? ?Last Vitals:  ?Vitals:  ? 10/30/21 2313 10/31/21 0830  ?BP: 102/60 124/74  ?Pulse: 73 71  ?Resp:  14  ?Temp:  36.7 ?C  ?SpO2:  99%  ?  ?Last Pain:  ?Vitals:  ? 10/31/21 0830  ?TempSrc: Oral  ?PainSc:   ? ? ?  ?  ?  ?  ?  ?  ? ?Cleda Mccreedy Brantlee Penn ? ? ? ? ?

## 2021-11-01 NOTE — Telephone Encounter (Signed)
Pt called back, said yesterday the pain was horrible and she didn't even bother to look these past few days at the pump. She has been taking some oxycodone prescribed. Per Abigaile, pt should follow instructions given at the hospital and remove the pump on day 4. Pt aware and will f/u as needed. ?

## 2021-11-01 NOTE — Telephone Encounter (Signed)
Pt left msg on triage saying she delivered via c section last Friday and she was told she should remove pain medication on day 4. She was told that when she removed the pain medication it will look like a "deflated balloon" and that's not how it looks. To her it looks like medicine was never released. Called pt back to get more info, no answer, LVMTRC. ?

## 2021-11-02 LAB — SURGICAL PATHOLOGY

## 2021-11-03 ENCOUNTER — Telehealth: Payer: Self-pay

## 2021-11-03 NOTE — Telephone Encounter (Signed)
Pt called triage reporting some swelling in the top of her feet. No h/a no blurry vision no redness on feet. Shes aware this is normal just the more she walks around this should go away and when sitting elevate feet. She has a f/up appointment  5/9 ?

## 2021-11-09 ENCOUNTER — Ambulatory Visit (INDEPENDENT_AMBULATORY_CARE_PROVIDER_SITE_OTHER): Payer: 59 | Admitting: Family Medicine

## 2021-11-09 ENCOUNTER — Encounter: Payer: Self-pay | Admitting: Family Medicine

## 2021-11-09 VITALS — BP 122/72 | Ht 61.0 in | Wt 181.0 lb

## 2021-11-09 DIAGNOSIS — Z5189 Encounter for other specified aftercare: Secondary | ICD-10-CM

## 2021-11-09 NOTE — Progress Notes (Signed)
? ? ? ? ?  Subjective:  ?  ?Kimberly Holder is a 41 y.o. female who presents to the clinic status post  on 10/29/21. The patient is not having any pain.  Eating a regular diet without difficulty. Bowel movements are normal. No other significant postoperative concerns. ? ?The following portions of the patient's history were reviewed and updated as appropriate: allergies, current medications, past family history, past medical history, past social history, past surgical history, and problem list..   ? ?  ? ? ?Review of Systems ?Pertinent items are noted in HPI. ?  ?Objective:  ? ?BP 122/72   Ht 5\' 1"  (1.549 m)   Wt 181 lb (82.1 kg)   Breastfeeding Yes   BMI 34.20 kg/m?  ?Constitutional:  Well-developed, well-nourished female in no acute distress.   ?Skin: Skin is warm and dry, no rash noted, not diaphoretic,no erythema, no pallor.  ?Cardiovascular: Normal heart rate noted  ?Respiratory: Effort and breath sounds normal, no problems with respiration noted  ?Abdomen: Soft, bowel sounds active, non-tender, no abnormal masses  ?Incision: Healing well, no drainage, no erythema, no hernia, no seroma, no swelling, no dehiscence, incision well approximated  ?Pelvic:   Deferred  ? ?Assessment:  ? ?Doing well postoperatively.   ?  ?Plan:  ? ?1. Continue any current medications. ?2. Wound care discussed. ?3. Activity restrictions: none ?4. Anticipated return to work:  at 8+ wk pp . ?5. Follow up as needed ?6.  Routine preventative health maintenance measures emphasized. ? ? ?Please refer to After Visit Summary for other counseling recommendations.  ? ?

## 2021-11-18 ENCOUNTER — Encounter: Payer: Self-pay | Admitting: Obstetrics and Gynecology

## 2021-11-23 ENCOUNTER — Telehealth: Payer: Self-pay

## 2021-11-23 NOTE — Telephone Encounter (Signed)
Pt calling; is at the dentist and has been numbed then it was decided she call us d/t breastfeeding and being postpartum.  [phone]  Called and left msg to call me back; Also sent msg thru MyChart for pt advising what the dental protocol says.

## 2021-12-08 ENCOUNTER — Encounter: Payer: Self-pay | Admitting: Obstetrics and Gynecology

## 2021-12-08 ENCOUNTER — Ambulatory Visit (INDEPENDENT_AMBULATORY_CARE_PROVIDER_SITE_OTHER): Payer: 59 | Admitting: Obstetrics and Gynecology

## 2021-12-08 DIAGNOSIS — Z302 Encounter for sterilization: Secondary | ICD-10-CM

## 2021-12-08 NOTE — Progress Notes (Signed)
Post Partum Visit Note  Kimberly Holder is a 41 y.o. [phone] female who presents for a postpartum visit. She is 6 weeks postpartum following a repeat cesarean section.  I have fully reviewed the prenatal and intrapartum course. The delivery was at 76 gestational weeks.  Anesthesia: spinal. Postpartum course has been uncomplicated. Baby is doing well. Baby is feeding by breast. Bleeding no bleeding. Bowel function is normal. Bladder function is normal. Patient is not sexually active. Contraception method is tubal ligation. Postpartum depression screening: negative.   The pregnancy intention screening data noted above was reviewed. Potential methods of contraception were discussed. The patient elected to proceed with No data recorded.   Edinburgh Postnatal Depression Scale - 12/08/21 1135       Edinburgh Postnatal Depression Scale:  In the Past 7 Days   I have been able to laugh and see the funny side of things. 0    I have looked forward with enjoyment to things. 0    I have blamed myself unnecessarily when things went wrong. 0    I have been anxious or worried for no good reason. 0    I have felt scared or panicky for no good reason. 0    Things have been getting on top of me. 0    I have been so unhappy that I have had difficulty sleeping. 0    I have felt sad or miserable. 0    I have been so unhappy that I have been crying. 0    The thought of harming myself has occurred to me. 0    Edinburgh Postnatal Depression Scale Total 0             Health Maintenance Due  Topic Date Due   COVID-19 Vaccine (1) Never done       Review of Systems Pertinent items noted in HPI and remainder of comprehensive ROS otherwise negative.  Objective:  BP 100/70   Ht 5\' 1"  (1.549 m)   Wt 180 lb (81.6 kg)   Breastfeeding Yes   BMI 34.01 kg/m    General:  alert, cooperative, and no distress   Breasts:  normal  Lungs: clear to auscultation bilaterally  Heart:  regular rate and rhythm   Abdomen: soft, non-tender; bowel sounds normal; no masses,  no organomegaly   Wound well approximated incision, healed well  GU exam:  not indicated       Assessment:    Normal postpartum exam.   Plan:   Essential components of care per ACOG recommendations:  1.  Mood and well being: Patient with negative depression screening today. Reviewed local resources for support.  - Patient tobacco use? No.   - hx of drug use? No.    2. Infant care and feeding:  -Patient currently breastmilk feeding? Yes. Reviewed importance of draining breast regularly to support lactation.  -Social determinants of health (SDOH) reviewed in EPIC. No concerns  3. Sexuality, contraception and birth spacing - Patient does not want a pregnancy in the next year.  Desired family size is 3 children.  -Tubal ligation performed at the time of her c-section  4. Sleep and fatigue -Encouraged family/partner/community support of 4 hrs of uninterrupted sleep to help with mood and fatigue  5. Physical Recovery  - Discussed patients delivery and complications. She describes her labor as good. - Patient had a C-section repeat; no problems after deliver.  - Patient has urinary incontinence? No. - Patient is safe to resume  physical and sexual activity  6.  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   Plan to return for colposcopy -Breast Cancer screening indicated? No.   7. Chronic Disease/Pregnancy Condition follow up: None  - PCP follow up  Mora Bellman, MD [facility]

## 2021-12-17 ENCOUNTER — Encounter: Payer: Self-pay | Admitting: Obstetrics and Gynecology

## 2021-12-27 ENCOUNTER — Telehealth: Payer: Self-pay

## 2022-01-25 ENCOUNTER — Other Ambulatory Visit: Payer: Self-pay | Admitting: Internal Medicine

## 2022-01-25 ENCOUNTER — Encounter: Payer: Self-pay | Admitting: Internal Medicine

## 2022-01-25 DIAGNOSIS — E663 Overweight: Secondary | ICD-10-CM

## 2022-01-25 MED ORDER — PHENTERMINE HCL 37.5 MG PO TABS: 37.5000 mg | ORAL_TABLET | Freq: Every day | ORAL | 0 refills | Status: DC

## 2022-04-14 ENCOUNTER — Telehealth: Payer: Self-pay

## 2022-04-14 NOTE — Telephone Encounter (Signed)
Pt has an annual scheduled with [facility] on 06/2022, she wants to know if a mammo order could be put in? Stopped breastfeeding two months ago.

## 2022-04-15 NOTE — Telephone Encounter (Signed)
Need to wait 6 months after BF before doing mammo. Will place order at annual.

## 2022-04-18 NOTE — Telephone Encounter (Signed)
Mammo not due till ~2/24 due to when stopped BF. Insurance will cover screening mammo regardless.

## 2022-04-19 NOTE — Telephone Encounter (Signed)
Pt aware.

## 2022-04-27 ENCOUNTER — Ambulatory Visit: Payer: 59 | Admitting: Internal Medicine

## 2022-05-02 ENCOUNTER — Ambulatory Visit: Payer: 59 | Admitting: Dermatology

## 2022-05-02 DIAGNOSIS — Z79899 Other long term (current) drug therapy: Secondary | ICD-10-CM

## 2022-05-02 DIAGNOSIS — L910 Hypertrophic scar: Secondary | ICD-10-CM | POA: Diagnosis not present

## 2022-05-02 DIAGNOSIS — L811 Chloasma: Secondary | ICD-10-CM | POA: Diagnosis not present

## 2022-05-02 NOTE — Progress Notes (Unsigned)
   Follow-Up Visit   Subjective  Kimberly Holder is a 41 y.o. female who presents for the following: No chief complaint on file..   The following portions of the chart were reviewed this encounter and updated as appropriate:       Review of Systems:  No other skin or systemic complaints except as noted in HPI or Assessment and Plan.  Objective  Well appearing patient in no apparent distress; mood and affect are within normal limits.  A focused examination was performed including the face and trunk. Relevant physical exam findings are noted in the Assessment and Plan.  Face Reticulated hyperpigmented patches.                R post shoulder Firm pink/brown dermal papule(s)/plaque(s).        Assessment & Plan  Melasma Face  No longer pregnant or breast feeding, pt had a tubal ligation but plans to start OBC due to PCOS. Due to [facility] do not recommend tranexamic acid.  Melasma is a chronic; persistent condition of hyperpigmented patches generally on the face, worse in summer due to higher UV exposure.    Heredity; thyroid disease; sun exposure; pregnancy; birth control pills; epilepsy medication and darker skin may predispose to Melasma.   Recommendations include: - Sun avoidance and daily broad spectrum (UVA/UVB) sunscreen SPF 30+, preferably with Zinc or Titanium Dioxide. - Rx topical bleaching creams (i.e. hydroquinone) is a common treatment but should not be used long term.  Hydroquinones may be mixed with retinoids; steroids; Kojic Acid. - Rx Azelaic Acid is also a treatment option that is safe for pregnancy (Category B). - OTC Heliocare can be helpful in control and prevention. - Oral Rx with Tranexamic Acid 250 mg - 650 mg po daily can be used for moderate to severe cases especially during summer (contraindications include pregnancy; lactation; hx of PE; DVT; clotting disorder; heart disease; anticoagulant use and upcoming long trips)   - Chemical peels (would  need multiple for best result).  - Lasers and  Microdermabrasion may also be helpful adjunct treatments.  Start Skin Medicinals mix QHS x 3 mths. Then take a 3 mths break from medication.   Keloid R post shoulder  Patient would like to start [place] injections but she would like to check with her insurance first to see what cost would be.   CPT 11900, [id number]   No follow-ups on file.  Luther Redo, CMA, am acting as scribe for Sarina Ser, MD .

## 2022-05-03 ENCOUNTER — Encounter: Payer: Self-pay | Admitting: Dermatology

## 2022-05-05 ENCOUNTER — Encounter: Payer: Self-pay | Admitting: Family Medicine

## 2022-05-05 ENCOUNTER — Ambulatory Visit: Payer: 59 | Admitting: Family Medicine

## 2022-05-05 ENCOUNTER — Ambulatory Visit (INDEPENDENT_AMBULATORY_CARE_PROVIDER_SITE_OTHER): Payer: 59 | Admitting: Family Medicine

## 2022-05-05 VITALS — BP 112/74 | HR 76 | Temp 98.2°F | Ht 61.0 in | Wt 172.6 lb

## 2022-05-05 DIAGNOSIS — R739 Hyperglycemia, unspecified: Secondary | ICD-10-CM | POA: Diagnosis not present

## 2022-05-05 DIAGNOSIS — R87610 Atypical squamous cells of undetermined significance on cytologic smear of cervix (ASC-US): Secondary | ICD-10-CM

## 2022-05-05 DIAGNOSIS — E669 Obesity, unspecified: Secondary | ICD-10-CM

## 2022-05-05 DIAGNOSIS — Z13220 Encounter for screening for lipoid disorders: Secondary | ICD-10-CM | POA: Diagnosis not present

## 2022-05-05 DIAGNOSIS — E559 Vitamin D deficiency, unspecified: Secondary | ICD-10-CM | POA: Diagnosis not present

## 2022-05-05 DIAGNOSIS — D649 Anemia, unspecified: Secondary | ICD-10-CM

## 2022-05-05 DIAGNOSIS — E663 Overweight: Secondary | ICD-10-CM

## 2022-05-05 DIAGNOSIS — Z1329 Encounter for screening for other suspected endocrine disorder: Secondary | ICD-10-CM

## 2022-05-05 DIAGNOSIS — Z1231 Encounter for screening mammogram for malignant neoplasm of breast: Secondary | ICD-10-CM

## 2022-05-05 NOTE — Patient Instructions (Addendum)
It was a pleasure meeting you today. Thank you for allowing me to take part in your health care.  Our goals for today as we discussed include:  We will get some labs today.  If they are abnormal or we need to do something about them, I will call you.  If they are normal, I will send you a message on MyChart (if it is active) or a letter in the mail.  If you don't hear from [place] in 2 weeks, please call the office at the number below.   Schedule fasting lab appointment in the next week or 2   Please follow-up with PCP in 2-3 months  If you have any questions or concerns, please do not hesitate to call the office at [phone].  I look forward to our next visit and until then take care and stay safe.  Regards,   Carollee Leitz, MD   [facility] Eating Following a healthy eating pattern may help you to achieve and maintain a healthy body weight, reduce the risk of chronic disease, and live a long and productive life. It is important to follow a healthy eating pattern at an appropriate calorie level for your body. Your nutritional needs should be met primarily through food by choosing a variety of nutrient-rich foods. What are tips for following this plan? Reading food labels Read labels and choose the following: Reduced or low sodium. Juices with 100% fruit juice. Foods with low saturated fats and high polyunsaturated and monounsaturated fats. Foods with whole grains, such as whole wheat, cracked wheat, brown rice, and wild rice. Whole grains that are fortified with folic acid. This is recommended for women who are pregnant or who want to become pregnant. Read labels and avoid the following: Foods with a lot of added sugars. These include foods that contain brown sugar, corn sweetener, corn syrup, dextrose, fructose, glucose, high-fructose corn syrup, honey, invert sugar, lactose, malt syrup, maltose, molasses, raw sugar, sucrose, trehalose, or turbinado sugar. Do  not eat more than the following amounts of added sugar per day: 6 teaspoons (25 g) for women. 9 teaspoons (38 g) for men. Foods that contain processed or refined starches and grains. Refined grain products, such as white flour, degermed cornmeal, white bread, and white rice. Shopping Choose nutrient-rich snacks, such as vegetables, whole fruits, and nuts. Avoid high-calorie and high-sugar snacks, such as potato chips, fruit snacks, and candy. Use oil-based dressings and spreads on foods instead of solid fats such as butter, stick margarine, or cream cheese. Limit pre-made sauces, mixes, and "instant" products such as flavored rice, instant noodles, and ready-made pasta. Try more plant-protein sources, such as tofu, tempeh, black beans, edamame, lentils, nuts, and seeds. Explore eating plans such as the Mediterranean diet or vegetarian diet. Cooking Use oil to saut or stir-fry foods instead of solid fats such as butter, stick margarine, or lard. Try baking, boiling, grilling, or broiling instead of frying. Remove the fatty part of meats before cooking. Steam vegetables in water or broth. Meal planning  At meals, imagine dividing your plate into fourths: One-half of your plate is fruits and vegetables. One-fourth of your plate is whole grains. One-fourth of your plate is protein, especially lean meats, poultry, eggs, tofu, beans, or nuts. Include low-fat dairy as part of your daily diet. Lifestyle Choose healthy options in all settings, including home, work, school, restaurants, or stores. Prepare your food safely: Wash your hands after handling raw meats. Keep food preparation surfaces  clean by regularly washing with hot, soapy water. Keep raw meats separate from ready-to-eat foods, such as fruits and vegetables. Cook seafood, meat, poultry, and eggs to the recommended internal temperature. Store foods at safe temperatures. In general: Keep cold foods at 23F (4.4C) or below. Keep  hot foods at 123F (60C) or above. Keep your freezer at [facility] (-17.8C) or below. Foods are no longer safe to eat when they have been between the temperatures of 40-123F (4.4-60C) for more than 2 hours. What foods should I eat? Fruits Aim to eat 2 cup-equivalents of fresh, canned (in natural juice), or frozen fruits each day. Examples of 1 cup-equivalent of fruit include 1 small apple, 8 large strawberries, 1 cup canned fruit,  cup dried fruit, or 1 cup 100% juice. Vegetables Aim to eat 2-3 cup-equivalents of fresh and frozen vegetables each day, including different varieties and colors. Examples of 1 cup-equivalent of vegetables include 2 medium carrots, 2 cups raw, leafy greens, 1 cup chopped vegetable (raw or cooked), or 1 medium baked potato. Grains Aim to eat 6 ounce-equivalents of whole grains each day. Examples of 1 ounce-equivalent of grains include 1 slice of bread, 1 cup ready-to-eat cereal, 3 cups popcorn, or  cup cooked rice, pasta, or cereal. Meats and other proteins Aim to eat 5-6 ounce-equivalents of protein each day. Examples of 1 ounce-equivalent of protein include 1 egg, 1/2 cup nuts or seeds, or 1 tablespoon (16 g) peanut butter. A cut of meat or fish that is the size of a deck of cards is about 3-4 ounce-equivalents. Of the protein you eat each week, try to have at least 8 ounces come from seafood. This includes salmon, trout, herring, and anchovies. Dairy Aim to eat 3 cup-equivalents of fat-free or low-fat dairy each day. Examples of 1 cup-equivalent of dairy include 1 cup (240 mL) milk, 8 ounces (250 g) yogurt, 1 ounces (44 g) natural cheese, or 1 cup (240 mL) fortified soy milk. Fats and oils Aim for about 5 teaspoons (21 g) per day. Choose monounsaturated fats, such as canola and olive oils, avocados, peanut butter, and most nuts, or polyunsaturated fats, such as sunflower, corn, and soybean oils, walnuts, pine nuts, sesame seeds, sunflower seeds, and  flaxseed. Beverages Aim for six 8-oz glasses of water per day. Limit coffee to three to five 8-oz cups per day. Limit caffeinated beverages that have added calories, such as soda and energy drinks. Limit alcohol intake to no more than 1 drink a day for nonpregnant women and 2 drinks a day for men. One drink equals 12 oz of beer (355 mL), 5 oz of wine (148 mL), or 1 oz of hard liquor (44 mL). Seasoning and other foods Avoid adding excess amounts of salt to your foods. Try flavoring foods with herbs and spices instead of salt. Avoid adding sugar to foods. Try using oil-based dressings, sauces, and spreads instead of solid fats. This information is based on general [place] nutrition guidelines. For more information, visit [employer]. Exact amounts may vary based on your nutrition needs. Summary A healthy eating plan may help you to maintain a healthy weight, reduce the risk of chronic diseases, and stay active throughout your life. Plan your meals. Make sure you eat the right portions of a variety of nutrient-rich foods. Try baking, boiling, grilling, or broiling instead of frying. Choose healthy options in all settings, including home, work, school, restaurants, or stores. This information is not intended to replace advice given to you by your health care  provider. Make sure you discuss any questions you have with your health care provider. Document Revised: 12/01/2021 Document Reviewed: 02/16/2021 Elsevier Patient Education  [place].

## 2022-05-05 NOTE — Progress Notes (Signed)
    SUBJECTIVE:   CHIEF COMPLAINT / HPI: transfer care  Patient presents to clinic to transfer care  No acute concerns.    Weight management Was taking Phentermine and had noticed weight loss.  Gained back.  Weight has been fluctuating.  Wondering about restarting medication.   PERTINENT  PMH / PSH:  PCOS Tubal Ligation Obesity class 1  OBJECTIVE:   BP 112/74 (BP Location: Left Arm, Patient Position: Sitting, Cuff Size: Normal)   Pulse 76   Temp 98.2 F (36.8 C) (Oral)   Ht 5\' 1"  (1.549 m)   Wt 172 lb 9.6 oz (78.3 kg)   LMP 04/07/2022   SpO2 98%   BMI 32.61 kg/m    General: Alert, no acute distress Cardio: Normal S1 and S2, RRR, no r/m/g Pulm: CTAB, normal work of breathing Abdomen: Bowel sounds normal. Abdomen soft and non-tender.  Extremities: No peripheral edema.  Neuro: Cranial nerves grossly intact   ASSESSMENT/PLAN:   Class 1 obesity BMI 32.6.  Lowest has been ~28.  Discussed Phentermine with patient to include not beneficial for long term weight management.  Discussed long term goals to include nutrition and exercise.  Could consider injectable weight loss medications to help but strongly encouraged to have plan to sustain weight loss.  Can journal food and fluid intake to discuss medications that would be appropriate to initiate.  Patient agreeable to start healthy lifestyle choices and journaling.  No history of thyroid cancer or pancreatitis.  Suspect insulin resistance, PCOS, perimenopause to be all attributable to difficulty losing weight.   -CBC, TSH, Fasting Lipids, Vit D, Vit B12, Cmet, A1c,  prior to next visit.   -Follow up in 2-3 months   Atypical squamous cells of undetermined significance (ASCUS) on Papanicolaou smear of cervix Chart review PAP 06/2018, ASCUS, High risk HPV negative PAP 07/2019 NILM, High risk HPV negative PAP 07/2020 LSIL High risk HPV negative Per ASCCP guidelines, clinical judgement Postnatal visit scheduled 11/22 with  OBGYN.  Can discuss repeat PAP during visit   HCM Mammogram referral sent Declined COVID and Flu vaccine  PDMP Reviewed  Carollee Leitz, MD

## 2022-05-13 ENCOUNTER — Other Ambulatory Visit (INDEPENDENT_AMBULATORY_CARE_PROVIDER_SITE_OTHER): Payer: 59

## 2022-05-13 DIAGNOSIS — E663 Overweight: Secondary | ICD-10-CM | POA: Diagnosis not present

## 2022-05-13 DIAGNOSIS — Z13220 Encounter for screening for lipoid disorders: Secondary | ICD-10-CM | POA: Diagnosis not present

## 2022-05-13 DIAGNOSIS — E559 Vitamin D deficiency, unspecified: Secondary | ICD-10-CM | POA: Diagnosis not present

## 2022-05-13 LAB — CBC WITH DIFFERENTIAL/PLATELET
Basophils Absolute: 0.1 10*3/uL (ref 0.0–0.1)
Basophils Relative: 0.9 % (ref 0.0–3.0)
Eosinophils Absolute: 0.2 10*3/uL (ref 0.0–0.7)
Eosinophils Relative: 2.1 % (ref 0.0–5.0)
HCT: 41.7 % (ref 36.0–46.0)
Hemoglobin: 14.1 g/dL (ref 12.0–15.0)
Lymphocytes Relative: 25.1 % (ref 12.0–46.0)
Lymphs Abs: 1.8 10*3/uL (ref 0.7–4.0)
MCHC: 33.8 g/dL (ref 30.0–36.0)
MCV: 88.9 fl (ref 78.0–100.0)
Monocytes Absolute: 0.5 10*3/uL (ref 0.1–1.0)
Monocytes Relative: 7 % (ref 3.0–12.0)
Neutro Abs: 4.7 10*3/uL (ref 1.4–7.7)
Neutrophils Relative %: 64.9 % (ref 43.0–77.0)
Platelets: 264 10*3/uL (ref 150.0–400.0)
RBC: 4.69 Mil/uL (ref 3.87–5.11)
RDW: 12.9 % (ref 11.5–15.5)
WBC: 7.2 10*3/uL (ref 4.0–10.5)

## 2022-05-13 LAB — COMPREHENSIVE METABOLIC PANEL
ALT: 17 U/L (ref 0–35)
AST: 13 U/L (ref 0–37)
Albumin: 4.2 g/dL (ref 3.5–5.2)
Alkaline Phosphatase: 119 U/L — ABNORMAL HIGH (ref 39–117)
BUN: 10 mg/dL (ref 6–23)
CO2: 31 mEq/L (ref 19–32)
Calcium: 9 mg/dL (ref 8.4–10.5)
Chloride: 104 mEq/L (ref 96–112)
Creatinine, Ser: 0.79 mg/dL (ref 0.40–1.20)
GFR: 92.56 mL/min (ref >60.00)
Glucose, Bld: 78 mg/dL (ref 70–99)
Potassium: 4.2 mEq/L (ref 3.5–5.1)
Sodium: 140 mEq/L (ref 135–145)
Total Bilirubin: 0.5 mg/dL (ref 0.2–1.2)
Total Protein: 7.1 g/dL (ref 6.0–8.3)

## 2022-05-13 LAB — IBC + FERRITIN
Ferritin: 59.5 ng/mL (ref 10.0–291.0)
Iron: 72 ug/dL (ref 42–145)
Saturation Ratios: 26 % (ref 20.0–50.0)
TIBC: 277.2 ug/dL (ref 250.0–450.0)
Transferrin: 198 mg/dL — ABNORMAL LOW (ref 212.0–360.0)

## 2022-05-13 LAB — LIPID PANEL
Cholesterol: 171 mg/dL (ref 0–200)
HDL: 56.7 mg/dL (ref >39.00)
LDL Cholesterol: 104 mg/dL — ABNORMAL HIGH (ref 0–99)
NonHDL: 114.61
Total CHOL/HDL Ratio: 3
Triglycerides: 53 mg/dL (ref 0.0–149.0)
VLDL: 10.6 mg/dL (ref 0.0–40.0)

## 2022-05-13 LAB — TSH: TSH: 0.61 u[IU]/mL (ref 0.35–5.50)

## 2022-05-13 LAB — VITAMIN B12: Vitamin B-12: 944 pg/mL — ABNORMAL HIGH (ref 211–911)

## 2022-05-13 LAB — HEMOGLOBIN A1C: Hgb A1c MFr Bld: 5.1 % (ref 4.6–6.5)

## 2022-05-13 LAB — VITAMIN D 25 HYDROXY (VIT D DEFICIENCY, FRACTURES): VITD: 24.39 ng/mL — ABNORMAL LOW (ref 30.00–100.00)

## 2022-05-21 ENCOUNTER — Encounter: Payer: Self-pay | Admitting: Family Medicine

## 2022-05-21 DIAGNOSIS — D649 Anemia, unspecified: Secondary | ICD-10-CM

## 2022-05-21 HISTORY — DX: Anemia, unspecified: D64.9

## 2022-05-21 NOTE — Assessment & Plan Note (Signed)
Chart review PAP 06/2018, ASCUS, High risk HPV negative PAP 07/2019 NILM, High risk HPV negative PAP 07/2020 LSIL High risk HPV negative Per ASCCP guidelines, clinical judgement Postnatal visit scheduled 11/22 with OBGYN.  Can discuss repeat PAP during visit

## 2022-05-21 NOTE — Assessment & Plan Note (Addendum)
BMI 32.6.  Lowest has been ~28.  Discussed Phentermine with patient to include not beneficial for long term weight management.  Discussed long term goals to include nutrition and exercise.  Could consider injectable weight loss medications to help but strongly encouraged to have plan to sustain weight loss.  Can journal food and fluid intake to discuss medications that would be appropriate to initiate.  Patient agreeable to start healthy lifestyle choices and journaling.  No history of thyroid cancer or pancreatitis.  Suspect insulin resistance, PCOS, perimenopause to be all attributable to difficulty losing weight.   -CBC, TSH, Fasting Lipids, Vit D, Vit B12, Cmet, A1c,  prior to next visit.   -Follow up in 2-3 months

## 2022-05-23 IMAGING — US US MFM FETAL BPP W/O NON-STRESS
1 series · 15 of 28 positions shown · non-contrast
Comparison: none

[Series 1: us mfm fetal bpp w/o non-stress · 44 acquisitions, 15 frames shown]
[im 1/44]
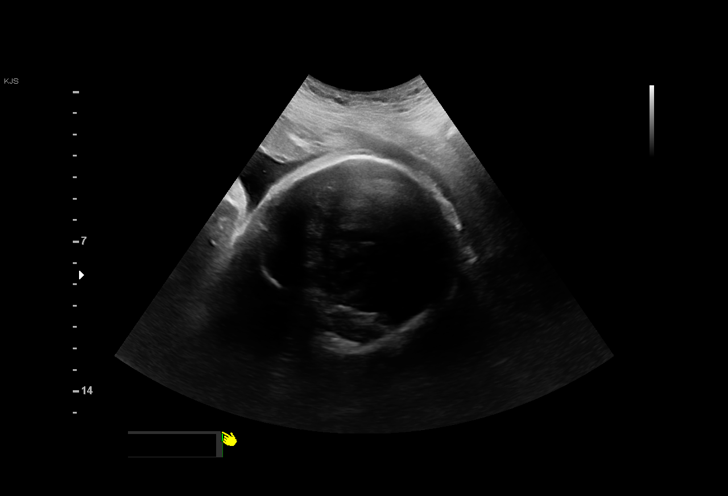
[im 4/44]
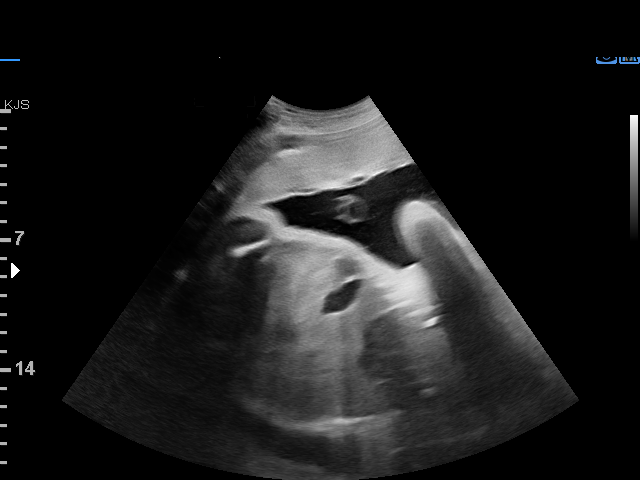
[im 7/44]
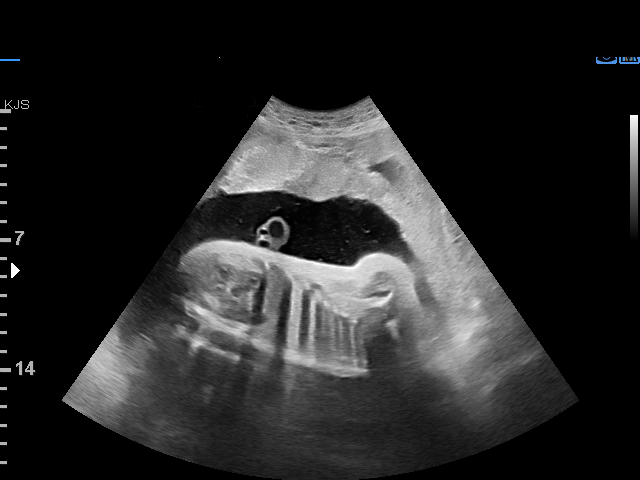
[im 10/44]
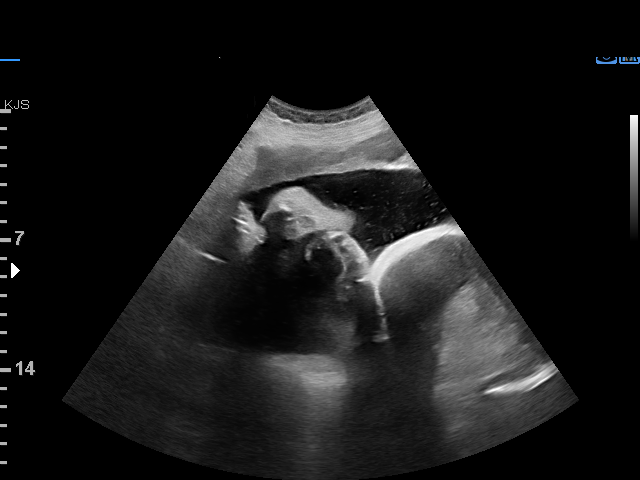
[im 13/44]
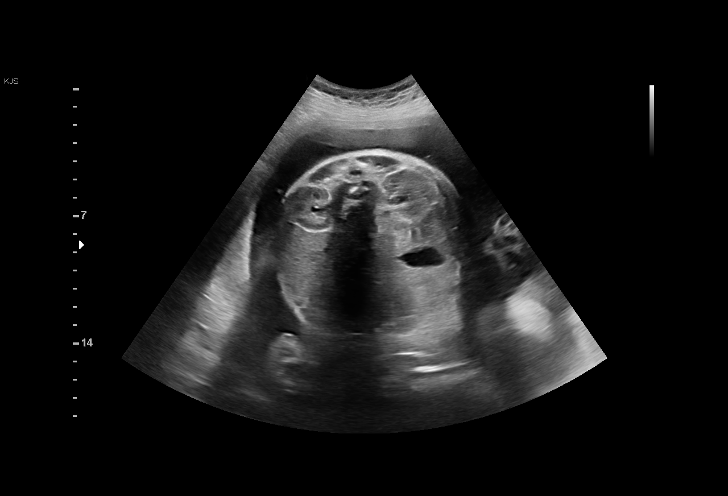
[im 16/44]
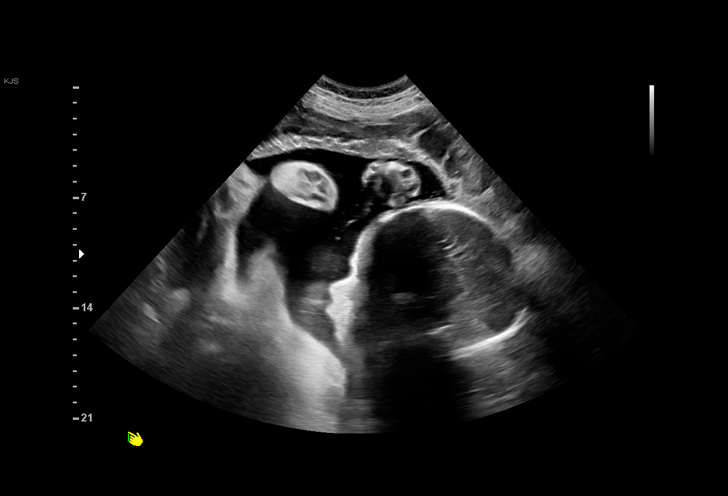
[im 20/44]
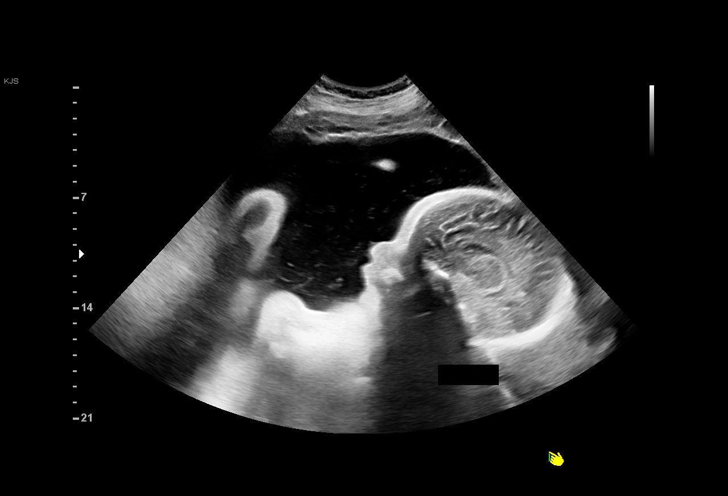
[im 23/44]
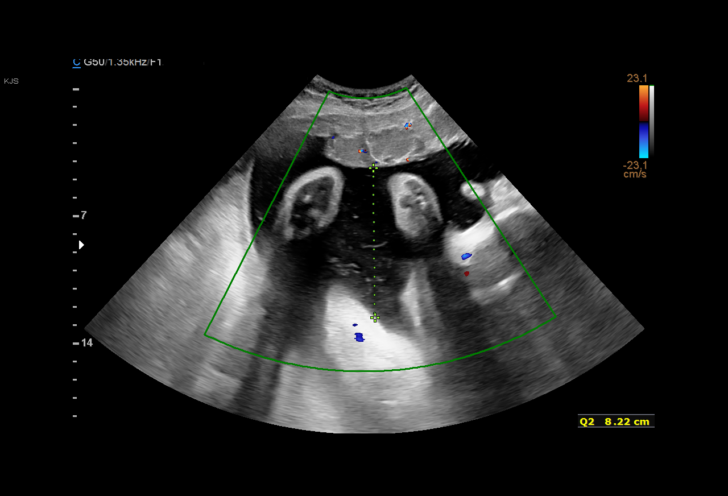
[im 24/44]
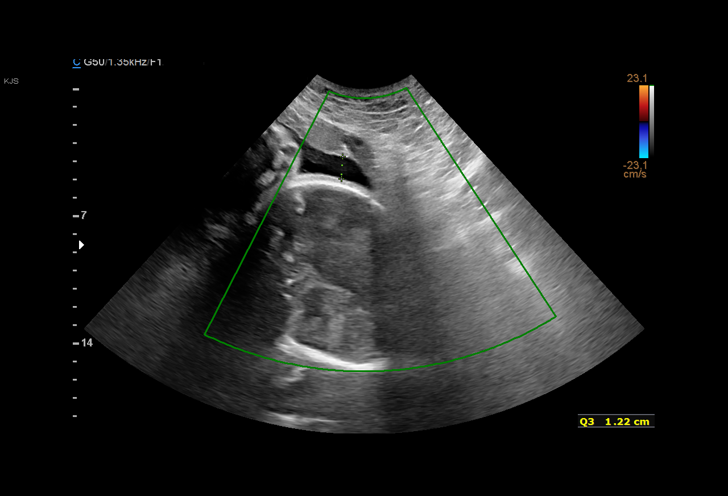
[im 28/44]
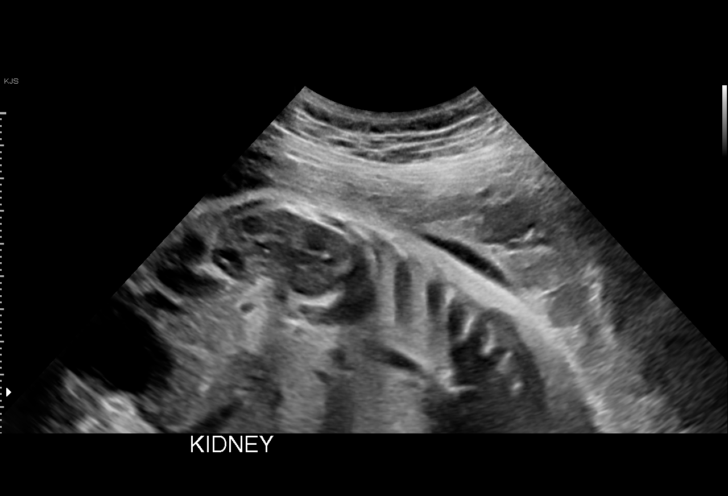
[im 31/44]
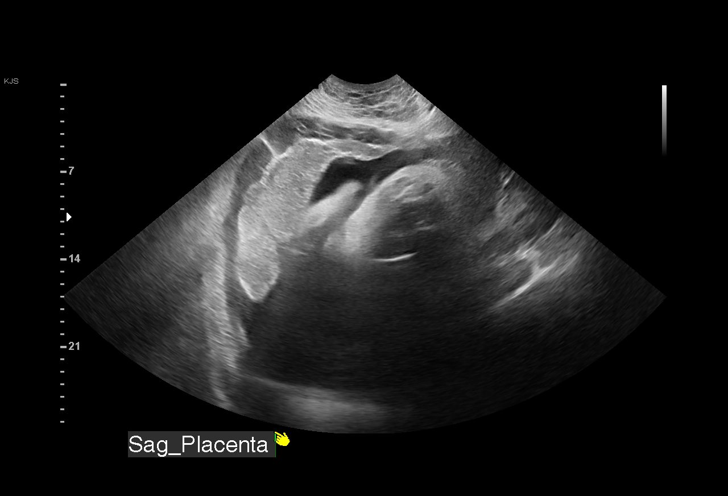
[im 34/44]
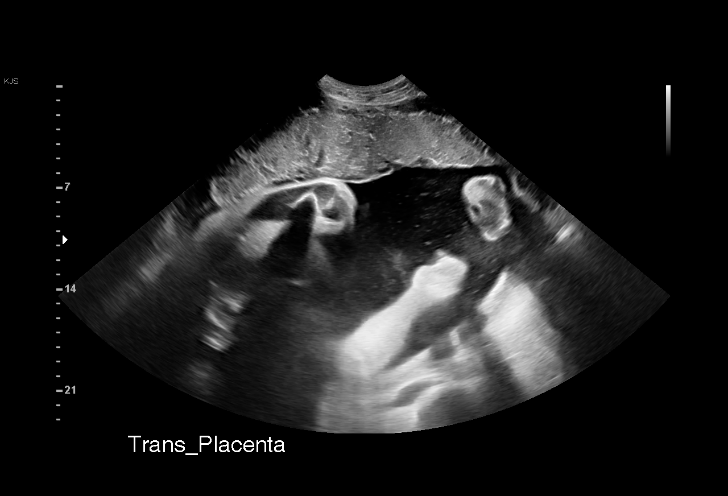
[im 37/44]
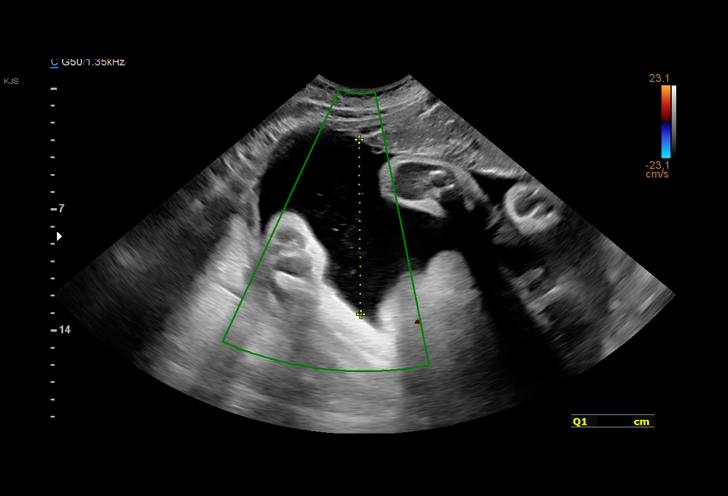
[im 40/44]
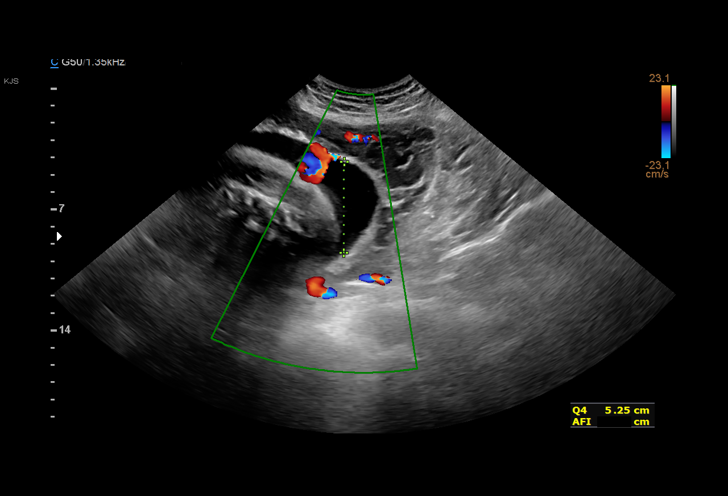
[im 44/44]
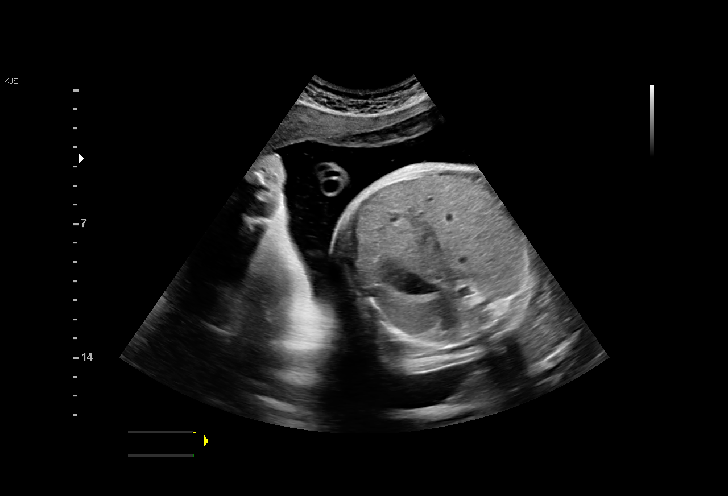

[15 of 28 positions shown; findings below may reference images not displayed]

Indications

 Polyhydramnios, third trimester, antepartum
 condition or complication, unspecified fetus
 Advanced maternal age multigravida 35+,
 third trimester
 Poor obstetric history: Previous
 preeclampsia / eclampsia/gestational HTN
 Obesity complicating pregnancy, third
 trimester (BMI 35)
 History of cesarean delivery, currently
 pregnant
 33 weeks gestation of pregnancy
 LR NIPS
Fetal Evaluation

 Num Of Fetuses:         1
 Fetal Heart Rate(bpm):  158
 Cardiac Activity:       Observed
 Presentation:           Cephalic
 Placenta:               Anterior
 P. Cord Insertion:      Previously Visualized

 Amniotic Fluid
 AFI FV:      Polyhydramnios

 AFI Sum(cm)     %Tile       Largest Pocket(cm)
 32.73           > 97
 RUQ(cm)       RLQ(cm)       LUQ(cm)        LLQ(cm)

Biophysical Evaluation

 Amniotic F.V:   Pocket => 2 cm             F. Tone:        Observed
 F. Movement:    Observed                   Score:          [DATE]
 F. Breathing:   Observed
OB History

 Blood Type:   A+
 Gravidity:    5         Term:   2        Prem:   0        SAB:   1
 TOP:          1       Ectopic:  0        Living: 2
Gestational Age

 LMP:           33w 1d        Date:  01/28/21                 EDD:   11/04/21
 Best:          33w 1d     Det. By:  LMP  (01/28/21)          EDD:   11/04/21
Impression

 Patient with polyhydramnios return for antenatal testing.  She
 does not have gestational diabetes.  Her pregnancy is well
 dated by sure LMP consistent with 12-week ultrasound.
 Obstetric history significant for 2 cesarean deliveries.

 On today's ultrasound, severe polyhydramnios is seen (AFI
 33 cm).  Good fetal activity seen.  The stomach and kidneys
 appear normal.  Cephalic presentation.  Placenta is anterior
 and there is no evidence of previa or placenta accreta
 spectrum.

 I explained that in the absence of gestational diabetes,
 because of polyhydramnios is not known and is not
 associated with adverse outcomes.  Some fetal anomalies
 may be evident only at postnatal life.

 Blood pressure today at her office is 125/66 mmHg.
Recommendations

 - Continue weekly BPP till delivery.
                 [place], [place]

## 2022-06-08 ENCOUNTER — Ambulatory Visit: Payer: 59 | Admitting: Family Medicine

## 2022-06-08 DIAGNOSIS — R87612 Low grade squamous intraepithelial lesion on cytologic smear of cervix (LGSIL): Secondary | ICD-10-CM | POA: Insufficient documentation

## 2022-06-08 NOTE — Progress Notes (Unsigned)
PCP:  [name], MD   No chief complaint on file.    HPI:      Ms. Kimberly Holder is a 41 y.o. [phone] whose LMP was No LMP recorded., presents today for her annual examination.  Her menses are regular every 28-30 days, lasting 4-5 days.  Dysmenorrhea none. She does not have intermenstrual bleeding. Gets headache before menses. Pt had baby since last appt 1/22.  Sex activity: single partner, contraception - OCP (estrogen/progesterone).  Last Pap: 07/27/20 Results were LGSIL, neg HPV DNA. Repeat due in 1 yr.  07/26/19  Results were: no abnormalities /neg HPV DNA; hx of ASCUS paps and pos HPV DNA since 2014; s/p mult colpos; repeat pap due today Hx of STDs: HPV  Last mammogram: 06/01/20  Results were: normal--routine follow-up in 12 months There is no FH of breast cancer. There is no FH of ovarian cancer. The patient does do self-breast exams.  Tobacco use: none Alcohol use: none No drug use.  Exercise: very active  She does get adequate calcium and Vitamin D in her diet.  Labs with PCP. TSH followed due to FH hypothyroidism in her mom.    Past Medical History:  Diagnosis Date   Advanced maternal age in multigravida 04/13/2021   Anemia 05/21/2022   Atypical squamous cells of undetermined significance (ASCUS) on Papanicolaou smear of cervix    Cervical high risk HPV (human papillomavirus) test positive 2014   COVID-19 06/23/2019   x 2 from 2020 to 2022   Encounter for sterilization    History of 2 cesarean sections 04/13/2021   OB/GYN  Counseling Note     41 y.o. [id number] at [redacted]w[redacted]d with Estimated Date of Delivery: 11/04/21 was seen today in office to discuss trial of labor after cesarean section (TOLAC) versus elective repeat cesarean delivery (ERCD). The following risks were discussed with the patient.     Risk of uterine rupture at term is 0.78 percent with TOLAC and 0.22 percent with ERCD. 1 in 10 uterine ruptures will re   Hx of preeclampsia, prior pregnancy, currently  pregnant 04/13/2021   Large for gestational age fetus affecting management of mother 09/02/2021   Obesity (BMI 30-39.9)    Overweight (BMI 25.0-29.9) 04/24/2020   PCOS (polycystic ovarian syndrome)    Polyhydramnios affecting pregnancy in third trimester 08/26/2021   Preeclampsia    during pregnancy   S/P cesarean section 10/29/2021   Supervision of high risk pregnancy, antepartum 04/13/2021          Nursing Staff  Provider  Office Location   [facility]  Dating   L=11  Language   English  Anatomy US      Flu Vaccine      Genetic Screen   NIPS: nml XY  TDaP vaccine      Hgb A1C or   GTT  Early : BMI 28 pre pregnancy.   A1C 4.9  Third trimester : 136  Covid        LAB RESULTS   Rhogam      Blood Type    A+  Feeding Plan  Breast  Antibody   negative  Contraception  BTL  Rubella   immune  Cir    Past Surgical History:  Procedure Laterality Date   CESAREAN SECTION  01/26/2006   CESAREAN SECTION  08/10/2011   CESAREAN SECTION WITH BILATERAL TUBAL LIGATION Bilateral 10/29/2021   Procedure: CESAREAN SECTION WITH BILATERAL TUBAL LIGATION;  Surgeon: Catalina Antigua, MD;  Location: [facility] ORS;  Service: Obstetrics;  Laterality: Bilateral;   COLPOSCOPY W/ BIOPSY / CURETTAGE  08/01/2014   BX Neg   TONSILLECTOMY AND ADENOIDECTOMY  1996    Family History  Problem Relation Age of Onset   Arthritis Mother    Diabetes Mother        also had a pituitary adenoma   Endocrine tumor Mother        cancerous pituitary tumor per pt    Cancer Mother 58       brain, lung cancer   Hypertension Father    Other Father        benign "neck" tumor   Arthritis Brother    Diabetes Maternal Grandmother    Heart disease Maternal Grandmother    Breast cancer Neg Hx     Social History   Socioeconomic History   Marital status: Married    Spouse name: Nestor Ramp "Thayer Ohm"   Number of children: 2   Years of education: Not on file   Highest education level: Not on file  Occupational History   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   Enjoys spending time with family.   Never smoker    Social Determinants of Health   Financial Resource Strain: Not on file  Food Insecurity: Not on file  Transportation Needs: Not on file  Physical Activity: Insufficiently Active (06/21/2017)   Exercise Vital Sign    Days of Exercise per Week: 6 days    Minutes of Exercise per Session: 20 min  Stress: No Stress Concern Present (06/21/2017)   [employer] of Occupational Health - Occupational Stress Questionnaire    Feeling of Stress : Not at all  Social Connections: Somewhat Isolated (06/21/2017)   Social Connection and Isolation Panel [NHANES]    Frequency of Communication with Friends and Family: More than three times a week    Frequency of Social Gatherings with Friends and Family: Three times a week    Attends Religious Services: Never    Active Member of Clubs or Organizations: No    Attends Banker Meetings: Never    Marital Status: Married  Catering manager Violence: Not At Risk (06/21/2017)   Humiliation, Afraid, Rape, and Kick questionnaire    Fear of Current or Ex-Partner: No    Emotionally Abused: No    Physically Abused: No    Sexually Abused: No     Current Outpatient Medications:    cholecalciferol (VITAMIN D3) 25 MCG (1000 UNIT) tablet, Take 1,000 Units by mouth daily., Disp: , Rfl:    Multiple Vitamin (MULTIVITAMIN) tablet, Take 1 tablet by mouth daily., Disp: , Rfl:       ROS:  Review of Systems  Constitutional:  Negative for fatigue, fever and unexpected weight change.  Respiratory:  Negative for cough, shortness of breath and wheezing.   Cardiovascular:  Negative for chest pain, palpitations and leg swelling.  Gastrointestinal:  Negative for blood in stool,  constipation, diarrhea, nausea and vomiting.  Endocrine: Negative for cold intolerance, heat intolerance and polyuria.  Genitourinary:  Negative for dyspareunia, dysuria, flank pain, frequency, genital sores, hematuria, menstrual problem, pelvic pain, urgency, vaginal bleeding, vaginal discharge and vaginal pain.  Musculoskeletal:  Negative for back pain, joint swelling and myalgias.  Skin:  Negative for rash.  Neurological:  Negative for dizziness, syncope, light-headedness, numbness and headaches.  Hematological:  Negative for adenopathy.  Psychiatric/Behavioral:  Negative for agitation, confusion, sleep disturbance and suicidal ideas. The patient is not nervous/anxious.   BREAST: No symptoms   Objective: There were no vitals taken for this visit.   Physical Exam Constitutional:      Appearance: She is well-developed.  Genitourinary:     Vulva normal.     Right Labia: No rash, tenderness or lesions.    Left Labia: No tenderness, lesions or rash.    No vaginal discharge, erythema or tenderness.      Right Adnexa: not tender and no mass present.    Left Adnexa: not tender and no mass present.    No cervical friability or polyp.     Uterus is not enlarged or tender.  Breasts:    Right: No mass, nipple discharge, skin change or tenderness.     Left: No mass, nipple discharge, skin change or tenderness.  Neck:     Thyroid: No thyromegaly.  Cardiovascular:     Rate and Rhythm: Normal rate and regular rhythm.     Heart sounds: Normal heart sounds. No murmur heard. Pulmonary:     Effort: Pulmonary effort is normal.     Breath sounds: Normal breath sounds.  Abdominal:      Palpations: Abdomen is soft.     Tenderness: There is no abdominal tenderness. There is no guarding or rebound.  Musculoskeletal:        General: Normal range of motion.     Cervical back: Normal range of motion.  Lymphadenopathy:     Cervical: No cervical adenopathy.  Neurological:     General: No focal deficit present.     Mental Status: She is alert and oriented to person, place, and time.     Cranial Nerves: No cranial nerve deficit.  Skin:    General: Skin is warm and dry.  Psychiatric:        Mood and Affect: Mood normal.        Behavior: Behavior normal.        Thought Content: Thought content normal.        Judgment: Judgment normal.  Vitals reviewed.     Assessment/Plan: Encounter for annual routine gynecological examination  Cervical cancer screening - Plan: Cytology - PAP  Screening for HPV (human papillomavirus) - Plan: Cytology - PAP  History of abnormal cervical Pap smear - Plan: Cytology - PAP; repeat pap today. Will f/u if abn  Encounter for surveillance of contraceptive pills - Plan: drospirenone-ethinyl estradiol (YAZ) 3-0.02 MG tablet; OCP RF  Encounter for screening mammogram for malignant neoplasm of breast - Plan: MM 3D SCREEN BREAST BILATERAL; pt to sched mammo 11/22  No orders of the defined types were placed in this encounter.            GYN counsel breast self exam, mammography screening, adequate intake of calcium and vitamin D, diet and exercise     F/U  No follow-ups on file.  Everlee Quakenbush B. Foy Vanduyne, PA-C 06/08/2022 8:18 PM

## 2022-06-09 ENCOUNTER — Ambulatory Visit (INDEPENDENT_AMBULATORY_CARE_PROVIDER_SITE_OTHER): Payer: 59 | Admitting: Obstetrics and Gynecology

## 2022-06-09 ENCOUNTER — Other Ambulatory Visit (HOSPITAL_COMMUNITY)
Admission: RE | Admit: 2022-06-09 | Discharge: 2022-06-09 | Disposition: A | Discharge status: Home / Self Care | Payer: 59 | Source: Ambulatory Visit | Attending: Obstetrics and Gynecology | Admitting: Obstetrics and Gynecology

## 2022-06-09 ENCOUNTER — Encounter: Payer: Self-pay | Admitting: Obstetrics and Gynecology

## 2022-06-09 VITALS — BP 100/60 | Ht 61.0 in | Wt 175.0 lb

## 2022-06-09 DIAGNOSIS — Z1151 Encounter for screening for human papillomavirus (HPV): Secondary | ICD-10-CM

## 2022-06-09 DIAGNOSIS — R87612 Low grade squamous intraepithelial lesion on cytologic smear of cervix (LGSIL): Secondary | ICD-10-CM | POA: Diagnosis not present

## 2022-06-09 DIAGNOSIS — Z124 Encounter for screening for malignant neoplasm of cervix: Secondary | ICD-10-CM

## 2022-06-09 DIAGNOSIS — Z1231 Encounter for screening mammogram for malignant neoplasm of breast: Secondary | ICD-10-CM

## 2022-06-09 DIAGNOSIS — Z01419 Encounter for gynecological examination (general) (routine) without abnormal findings: Secondary | ICD-10-CM

## 2022-06-09 NOTE — Patient Instructions (Addendum)
I value your feedback and you entrusting us with your care. If you get a [facility] patient survey, I would appreciate you taking the time to let us know about your experience today. Thank you!  [facility]: [phone]      

## 2022-06-13 LAB — CYTOLOGY - PAP
Comment: NEGATIVE
Diagnosis: NEGATIVE
High risk HPV: NEGATIVE

## 2022-06-17 IMAGING — US US MFM FETAL BPP W/O NON-STRESS
1 series · 12 of 28 positions shown · non-contrast
Comparison: none

[Series 1: us mfm fetal bpp w/o non-stress · 40 acquisitions, 12 frames shown]
[im 2/40]
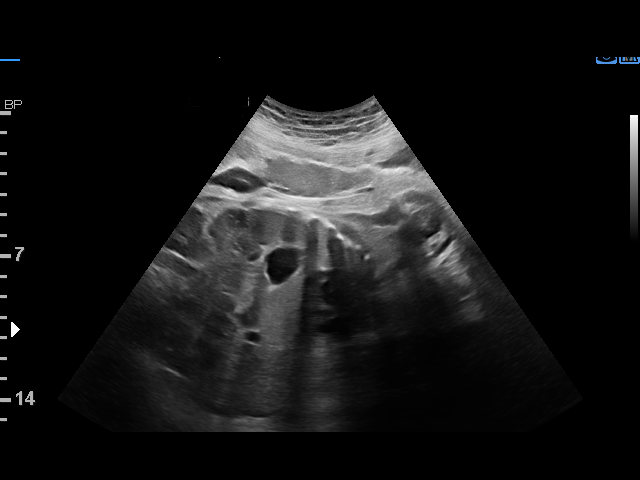
[im 5/40]
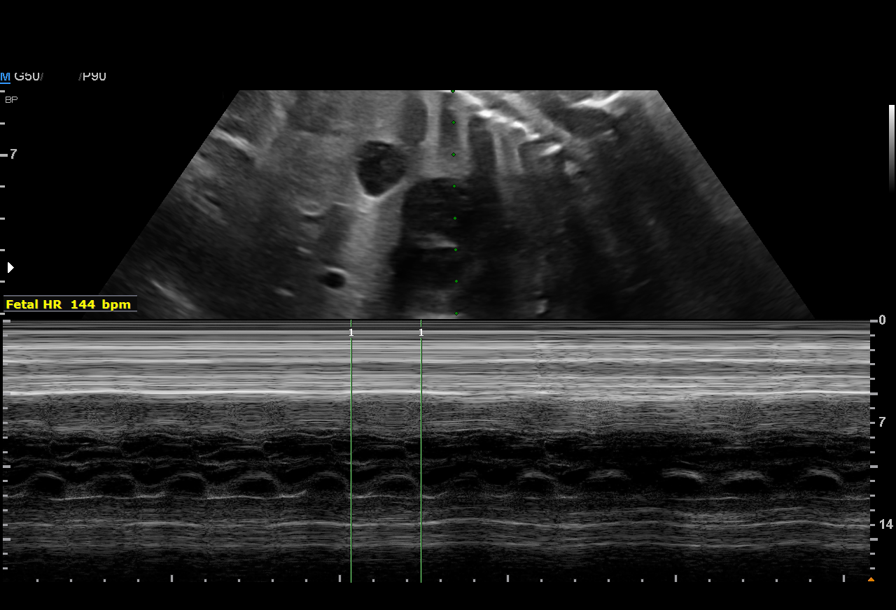
[im 8/40]
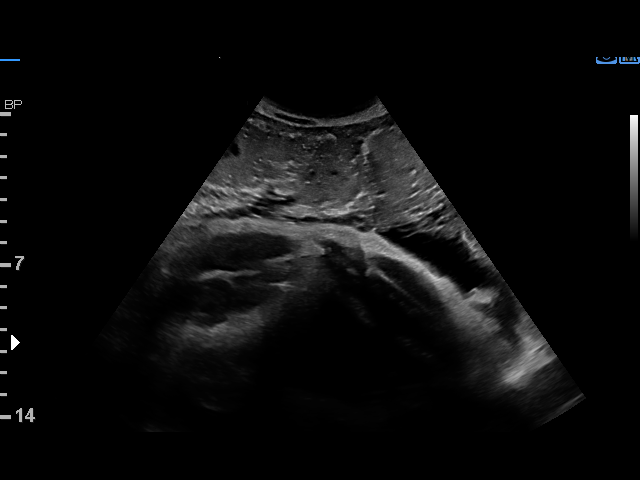
[im 12/40]
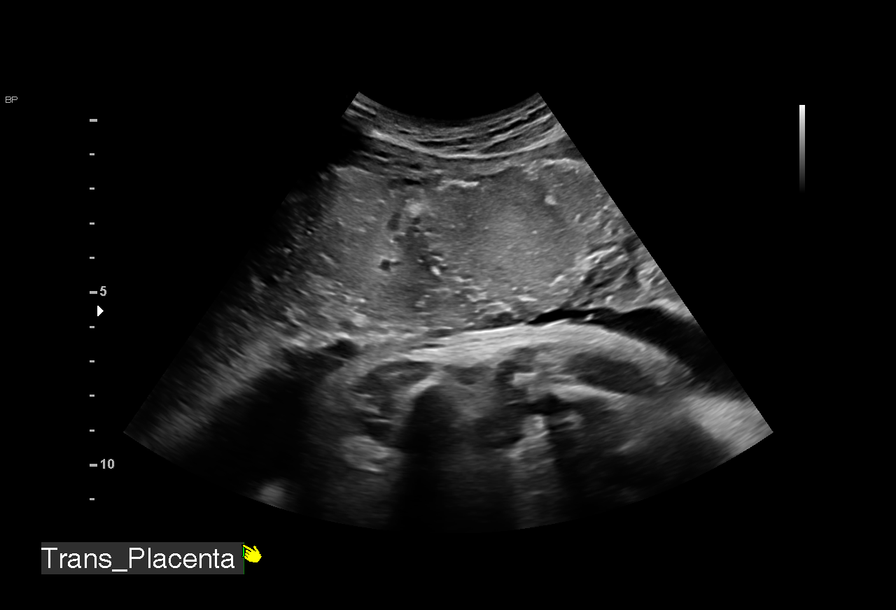
[im 15/40]
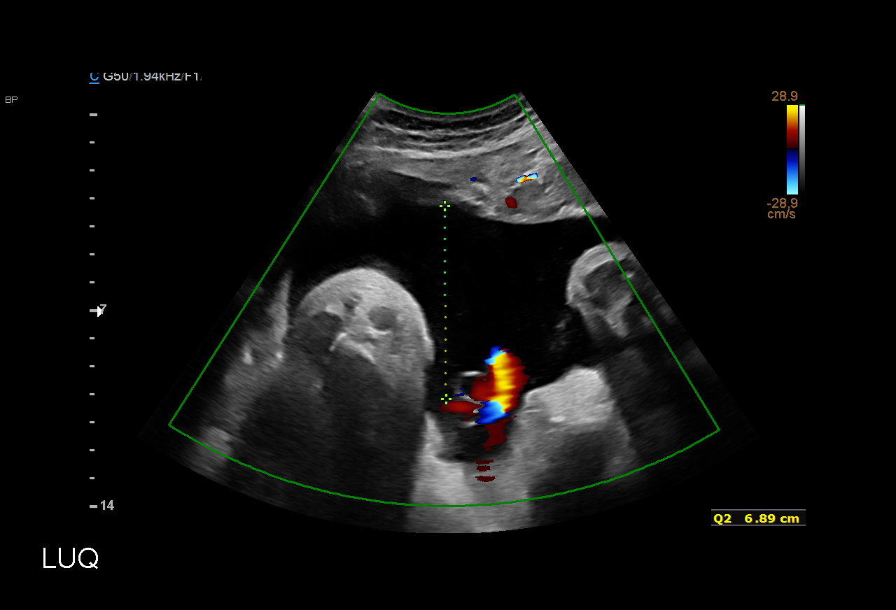
[im 18/40]
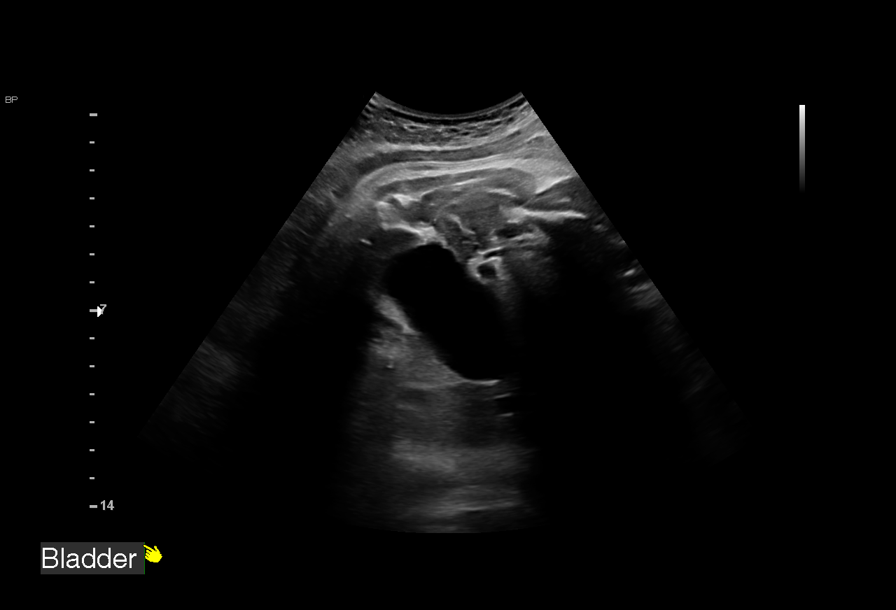
[im 22/40]
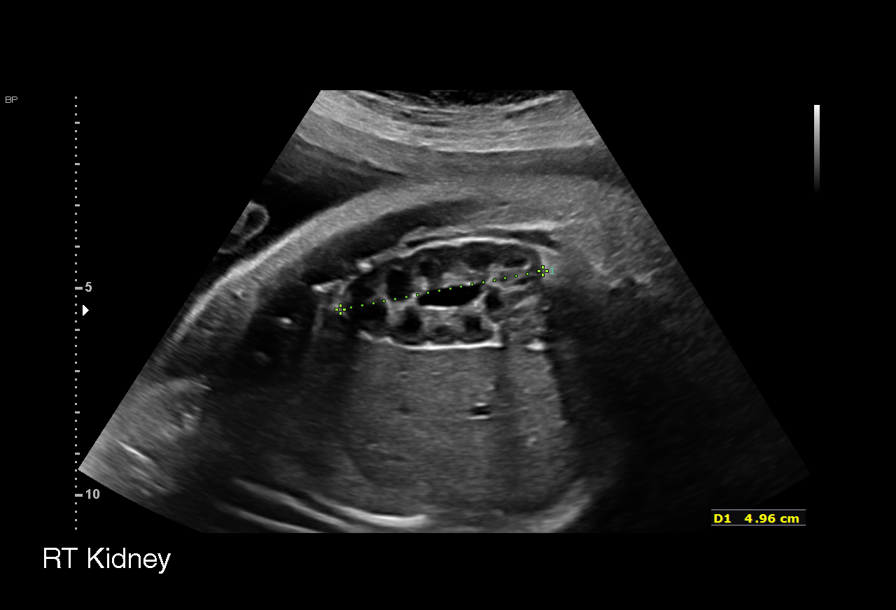
[im 25/40]
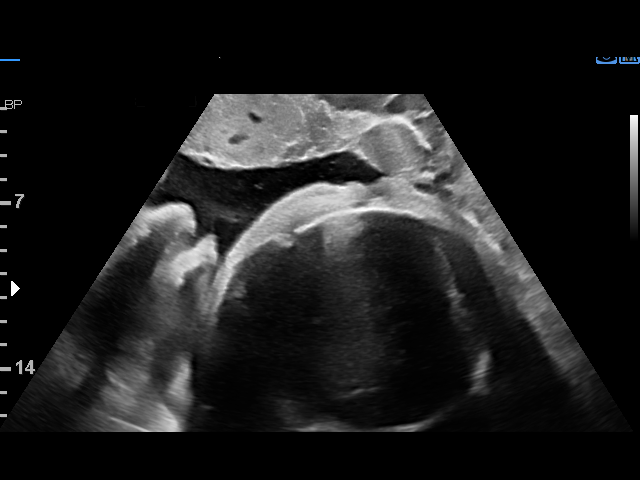
[im 28/40]
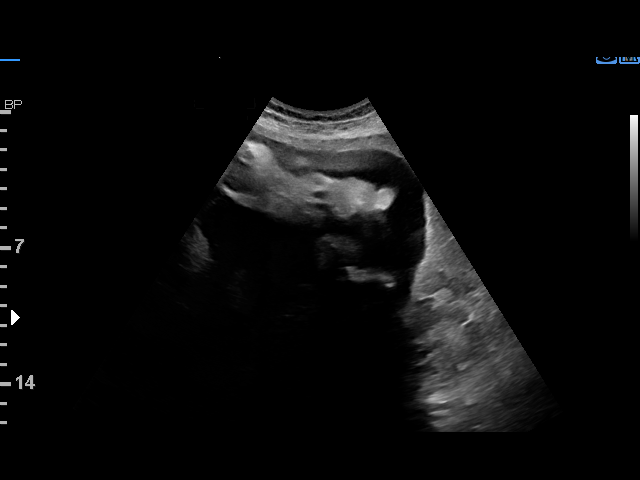
[im 32/40]
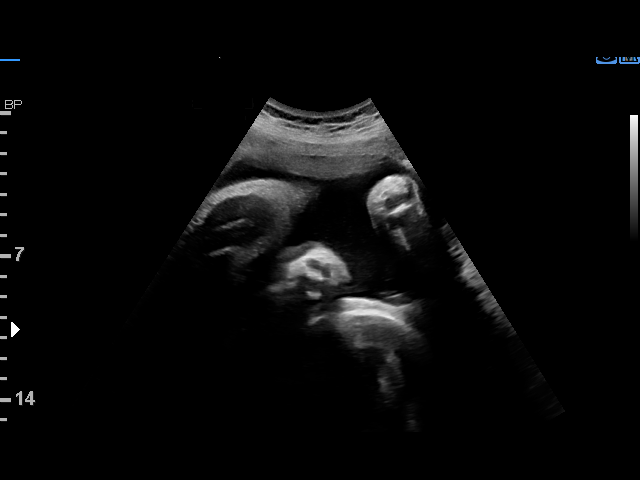
[im 35/40]
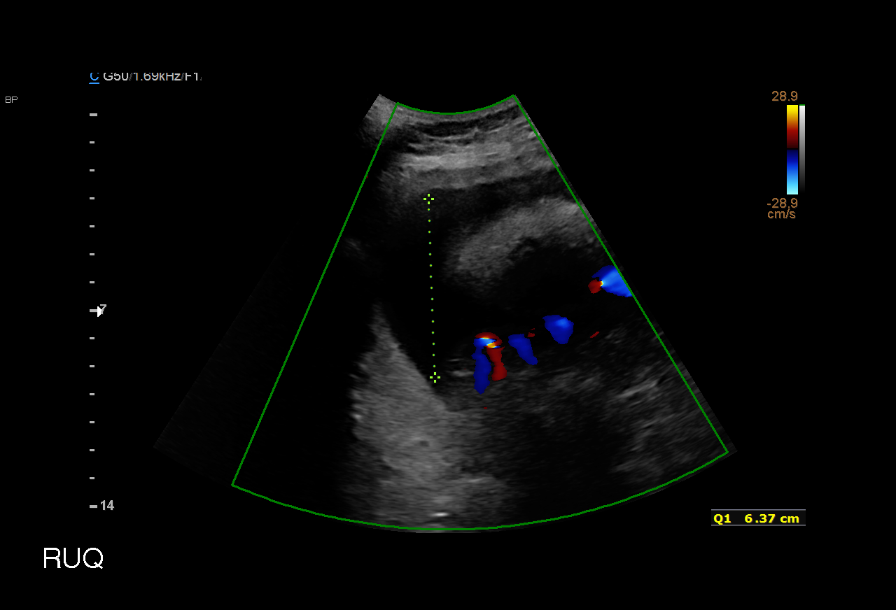
[im 38/40]
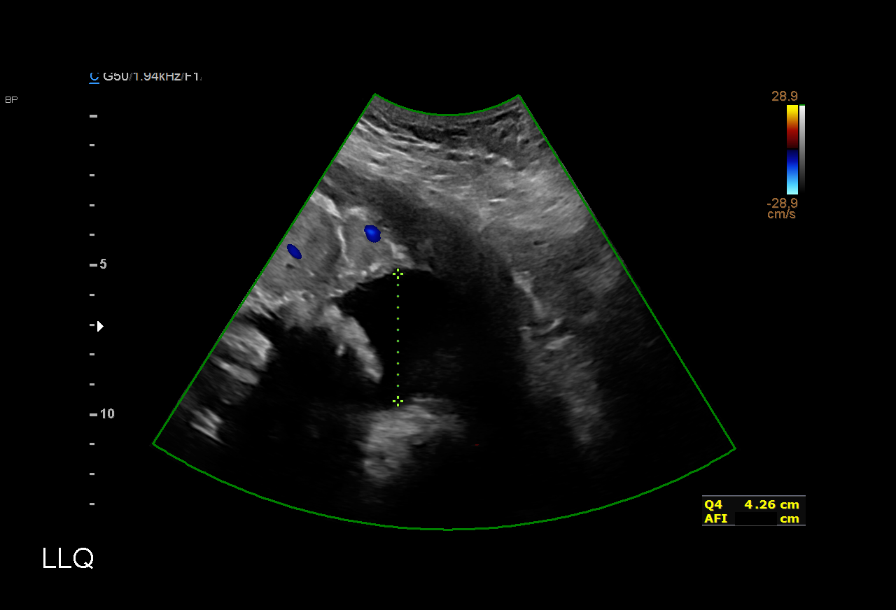

[12 of 28 positions shown; findings below may reference images not displayed]

Indications

 Polyhydramnios, third trimester, antepartum
 condition or complication, unspecified fetus
 Advanced maternal age multigravida 35+,
 third trimester
 Poor obstetric history: Previous
 preeclampsia / eclampsia/gestational HTN
 Obesity complicating pregnancy, third
 trimester (BMI 35)
 History of cesarean delivery, currently
 pregnant
 LR NIPS
 36 weeks gestation of pregnancy
Fetal Evaluation

 Num Of Fetuses:         1
 Fetal Heart Rate(bpm):  144
 Cardiac Activity:       Observed
 Presentation:           Cephalic
 Placenta:               Anterior
 P. Cord Insertion:      Previously Visualized

 Amniotic Fluid
 AFI FV:      Within normal limits

 AFI Sum(cm)     %Tile       Largest Pocket(cm)
 20.74           79
 RUQ(cm)       RLQ(cm)       LUQ(cm)        LLQ(cm)

Biophysical Evaluation

 Amniotic F.V:   Pocket => 2 cm             F. Tone:        Observed
 F. Movement:    Observed                   Score:          [DATE]
 F. Breathing:   Observed
OB History

 Blood Type:   A+
 Gravidity:    5         Term:   2        Prem:   0        SAB:   1
 TOP:          1       Ectopic:  0        Living: 2
Gestational Age

 LMP:           36w 5d        Date:  01/28/21                 EDD:   11/04/21
 Best:          36w 5d     Det. By:  LMP  (01/28/21)          EDD:   11/04/21
Anatomy

 Cranium:               Appears normal         Stomach:                Appears normal, left
                                                                       sided
 Cavum:                 Appears normal         Kidneys:                Appear normal
 Diaphragm:             Appears normal         Bladder:                Appears normal

 Other:  Technically difficult due to advanced gestational age.
Cervix Uterus Adnexa

 Cervix
 Not visualized (advanced GA >21wks)

 Uterus
 Normal shape and size.

 Right Ovary
 Not visualized.

 Left Ovary
 Not visualized.
Impression

 Antenatal testing performed given advanced maternal age of
 40 and prior exam with polyhydramnios
 The biophysical profile was [DATE] with good fetal movement and
 amniotic fluid volume.
Recommendations

 Continue weekly testing.

## 2022-06-23 ENCOUNTER — Telehealth: Payer: Self-pay | Admitting: Family Medicine

## 2022-06-23 DIAGNOSIS — Z0279 Encounter for issue of other medical certificate: Secondary | ICD-10-CM

## 2022-06-23 NOTE — Telephone Encounter (Signed)
Called Patient to let her know that we have the physical form filled out but she will have to come by and sign a charge form before it can be faxed

## 2022-06-23 NOTE — Telephone Encounter (Signed)
Pt called wanting to make sure her physical wellness paper was faxed on 11/2 because it need to be done before the end of the year. Pt would like to be called

## 2022-06-24 NOTE — Telephone Encounter (Signed)
Patient came in and signed. Paper work with charge form haned to [place] on 06/24/2022

## 2022-06-24 NOTE — Telephone Encounter (Signed)
Paperwork was faxed to [employer] number.

## 2022-08-06 NOTE — Patient Instructions (Incomplete)
It was a pleasure meeting you today. Thank you for allowing me to take part in your health care.  Our goals for today as we discussed include:  Referral sent for Mammogram. Please call to schedule appointment. [facility] [place], [place] [address] [phone]   Medications we discussed. Orlistat pill form Qsymia pill form Zepbound weekly injectable Wegovy weekly injectable Saxenda daily injectable   Please let me know which works better for you   If you have any questions or concerns, please do not hesitate to call the office at [phone].  I look forward to our next visit and until then take care and stay safe.  Regards,   Carollee Leitz, MD   [facility]

## 2022-08-06 NOTE — Progress Notes (Unsigned)
   SUBJECTIVE:  No chief complaint on file.  HPI Patient presents to clinic for follow-up weight management.   PERTINENT PMH / PSH: ***  OBJECTIVE:  There were no vitals taken for this visit.   Physical Exam  ASSESSMENT/PLAN:  There are no diagnoses linked to this encounter. PDMP reviewed***  No follow-ups on file.  Carollee Leitz, MD

## 2022-08-08 ENCOUNTER — Ambulatory Visit: Payer: 59 | Admitting: Family Medicine

## 2022-08-08 ENCOUNTER — Encounter: Payer: Self-pay | Admitting: Family Medicine

## 2022-08-08 VITALS — BP 100/70 | HR 76 | Temp 98.3°F | Resp 17 | Ht 61.0 in | Wt 168.5 lb

## 2022-08-08 DIAGNOSIS — E559 Vitamin D deficiency, unspecified: Secondary | ICD-10-CM | POA: Diagnosis not present

## 2022-08-08 DIAGNOSIS — Z1329 Encounter for screening for other suspected endocrine disorder: Secondary | ICD-10-CM

## 2022-08-08 DIAGNOSIS — R748 Abnormal levels of other serum enzymes: Secondary | ICD-10-CM

## 2022-08-08 DIAGNOSIS — E669 Obesity, unspecified: Secondary | ICD-10-CM | POA: Diagnosis not present

## 2022-08-08 LAB — VITAMIN B12: Vitamin B-12: 885 pg/mL (ref 211–911)

## 2022-08-08 LAB — VITAMIN D 25 HYDROXY (VIT D DEFICIENCY, FRACTURES): VITD: 22.13 ng/mL — ABNORMAL LOW (ref 30.00–100.00)

## 2022-08-08 MED ORDER — VITAMIN D (ERGOCALCIFEROL) 1.25 MG (50000 UNIT) PO CAPS: 50000.0000 [IU] | ORAL_CAPSULE | ORAL | 0 refills | Status: DC

## 2022-08-08 NOTE — Assessment & Plan Note (Signed)
Chronic.  Had increased vitamin D3 from 1000 IUs to 2000 IUs daily. Check vitamin D levels.  If remains low will discontinue above and start vitamin D 1.25 mg weekly x 12 weeks.

## 2022-08-08 NOTE — Assessment & Plan Note (Signed)
Chronic.  Weight has decreased 7 pounds since last visit.  BMI dropped from 32.6-31.84.  Has been trying to watch food and eat healthy.  Plans to include exercise and to diet.  Recently restarted at work after maternity leave. Discussed in detail different options for long-term success Qsymia, Wegovy, Saxenda and Zepbound No previous history of thyroid cancer or family history of thyroid cancer, pancreatitis. Could consider phentermine at low-dose for 3 months.  If able to maintain 5% of weight would continue for another 3 months.  Ideally I would like for her to have a more long-term sustainable weight loss plan. Nutrition consult would be beneficial as well.  Patient will consider.

## 2022-08-08 NOTE — Assessment & Plan Note (Signed)
Recent B12 level elevated.  Stopped multivitamins and B12 Gummies Repeat B12 levels today.

## 2022-08-08 NOTE — Addendum Note (Signed)
Addended by: Lanice Shirts on: 08/08/2022 05:00 PM   Modules accepted: Orders

## 2022-08-16 ENCOUNTER — Encounter: Payer: Self-pay | Admitting: Family Medicine

## 2022-08-17 ENCOUNTER — Other Ambulatory Visit: Payer: Self-pay | Admitting: Family Medicine

## 2022-08-17 MED ORDER — PHENTERMINE HCL 15 MG PO CAPS: 15.0000 mg | ORAL_CAPSULE | ORAL | 0 refills | Status: DC

## 2022-08-17 NOTE — Progress Notes (Signed)
Unable to obtain Wegovy, Saxenda, Orlistat, or Qsymia through insurance.  Will try Phentermine 15 mg daily for 30 days Reevaluate for effectiveness in 1 month.   Carollee Leitz, MD

## 2022-08-29 ENCOUNTER — Ambulatory Visit
Admission: RE | Admit: 2022-08-29 | Discharge: 2022-08-29 | Disposition: A | Discharge status: Home / Self Care | Payer: 59 | Source: Ambulatory Visit | Attending: Obstetrics and Gynecology | Admitting: Obstetrics and Gynecology

## 2022-08-29 DIAGNOSIS — Z1231 Encounter for screening mammogram for malignant neoplasm of breast: Secondary | ICD-10-CM | POA: Diagnosis present

## 2022-10-17 ENCOUNTER — Encounter: Payer: Self-pay | Admitting: Family Medicine

## 2022-10-17 ENCOUNTER — Other Ambulatory Visit: Payer: Self-pay | Admitting: Family Medicine

## 2022-10-24 NOTE — Telephone Encounter (Signed)
Patient called back to schedule an appointment to check her weight.  After consulting with Thurmond Butts, CMA, I scheduled patient to see Dr. [name] on 10/27/2022 at 3:40pm.  This was originally a "virtual" appointment slot, please let me know if we need to find another time.

## 2022-10-27 ENCOUNTER — Ambulatory Visit (INDEPENDENT_AMBULATORY_CARE_PROVIDER_SITE_OTHER): Payer: 59 | Admitting: Family Medicine

## 2022-10-27 ENCOUNTER — Encounter: Payer: Self-pay | Admitting: Family Medicine

## 2022-10-27 VITALS — BP 120/72 | HR 73 | Temp 98.5°F | Ht 61.0 in | Wt 168.8 lb

## 2022-10-27 DIAGNOSIS — Z6831 Body mass index (BMI) 31.0-31.9, adult: Secondary | ICD-10-CM | POA: Diagnosis not present

## 2022-10-27 DIAGNOSIS — E669 Obesity, unspecified: Secondary | ICD-10-CM

## 2022-10-27 DIAGNOSIS — E66811 Obesity, class 1: Secondary | ICD-10-CM

## 2022-10-27 MED ORDER — PHENTERMINE HCL 37.5 MG PO CAPS: 37.5000 mg | ORAL_CAPSULE | ORAL | 0 refills | Status: DC

## 2022-10-27 NOTE — Progress Notes (Signed)
   SUBJECTIVE:   Chief Complaint  Patient presents with   Weight Check   HPI Patient presents to clinic to discuss weight loss management.  Was prescribed phentermine 15 mg daily.  Had not seen any success.  Was out of medication for 3 weeks.  Reports having had more success with higher dose phentermine.  Denies any chest pain, shortness of breath, heart palpitations.  Weight has remained unchanged since last visit.  Would like to get back to prebaby weight.  Has been active and making healthy choices with nutrition.  PERTINENT PMH / PSH: PCOS Class I obesity   OBJECTIVE:  BP 120/72   Pulse 73   Temp 98.5 F (36.9 C) (Oral)   Ht 5\' 1"  (1.549 m)   Wt 168 lb 12.8 oz (76.6 kg)   SpO2 97%   BMI 31.89 kg/m    Physical Exam Vitals reviewed.  Constitutional:      General: She is not in acute distress.    Appearance: Normal appearance. She is obese. She is not ill-appearing, toxic-appearing or diaphoretic.  Eyes:     General:        Right eye: No discharge.        Left eye: No discharge.     Conjunctiva/sclera: Conjunctivae normal.  Cardiovascular:     Rate and Rhythm: Normal rate and regular rhythm.     Heart sounds: Normal heart sounds.  Pulmonary:     Effort: Pulmonary effort is normal.     Breath sounds: Normal breath sounds.  Musculoskeletal:        General: Normal range of motion.  Skin:    General: Skin is warm and dry.  Neurological:     Mental Status: She is alert and oriented to person, place, and time. Mental status is at baseline.  Psychiatric:        Mood and Affect: Mood normal.        Behavior: Behavior normal.        Thought Content: Thought content normal.        Judgment: Judgment normal.     ASSESSMENT/PLAN:  Class 1 obesity Assessment & Plan: Chronic.  Had tried 3 months of phentermine 15 mg without success.  Suspect PCOS history challenging for weight loss.  Weight unchanged since last visit. Monitoring diet and increased activity Will  trial phentermine 37.5 mg daily x 90 tablets.  Discussed with patient if no weight loss of 3% at next visit we will discontinue medication.  Follow-up in 3 months   Orders: -     Phentermine HCl; Take 1 capsule (37.5 mg total) by mouth every morning.  Dispense: 90 capsule; Refill: 0   PDMP reviewed  Return in about 3 months (around 01/26/2023) for PCP.  [name], MD

## 2022-10-27 NOTE — Patient Instructions (Signed)
It was a pleasure meeting you today. Thank you for allowing me to take part in your health care.  Our goals for today as we discussed include:  Start Phentermine 37.5 mg daily  Continue to modify diet and increase activity  Follow up in 3 months   If you have any questions or concerns, please do not hesitate to call the office at [phone].  I look forward to our next visit and until then take care and stay safe.  Regards,   [name], MD   [facility]

## 2022-11-08 ENCOUNTER — Encounter: Payer: Self-pay | Admitting: Family Medicine

## 2022-11-08 NOTE — Assessment & Plan Note (Addendum)
Chronic.  Had tried 3 months of phentermine 15 mg without success.  Suspect PCOS history challenging for weight loss.  Weight unchanged since last visit. Monitoring diet and increased activity Will trial phentermine 37.5 mg daily x 90 tablets.  Discussed with patient if no weight loss of 3% at next visit we will discontinue medication.  Follow-up in 3 months

## 2022-11-23 ENCOUNTER — Ambulatory Visit: Payer: 59 | Admitting: Dermatology

## 2023-01-25 ENCOUNTER — Ambulatory Visit (INDEPENDENT_AMBULATORY_CARE_PROVIDER_SITE_OTHER): Payer: 59 | Admitting: Family Medicine

## 2023-01-25 DIAGNOSIS — Z6828 Body mass index (BMI) 28.0-28.9, adult: Secondary | ICD-10-CM

## 2023-01-25 DIAGNOSIS — E669 Obesity, unspecified: Secondary | ICD-10-CM

## 2023-01-25 MED ORDER — PHENTERMINE HCL 37.5 MG PO CAPS: 37.5000 mg | ORAL_CAPSULE | ORAL | 0 refills | Status: DC

## 2023-01-25 NOTE — Patient Instructions (Addendum)
It was a pleasure meeting you today. Thank you for allowing me to take part in your health care.  Our goals for today as we discussed include:  Congratulations on your weight loss.  Will continue Phentermine 37.5 mg daily for 3 months. Then take a medication holiday.  Important to continue healthy eating and increase activity at this time.  Follow up in 3 months  If you have any questions or concerns, please do not hesitate to call the office at [phone].  I look forward to our next visit and until then take care and stay safe.  Regards,   [name], MD   [facility]

## 2023-01-25 NOTE — Progress Notes (Signed)
   SUBJECTIVE:   Chief Complaint  Patient presents with   Weight Check    Weight management. Pt states no other concerns.    HPI Patient presents to clinic to discuss weight loss management.  Doing well on Phentermine 37.5 mg daily.  Weight decreased 17lbs since last visit.  Has been working out, at the pool daily, portion control is going well.  Protein intake has been a little low but is working on increasing.  Hydrating well.  Denies any chest pain, heart palpitations, shortness of breath.  Would like to continue medication for now.  PERTINENT PMH / PSH: PCOS Class I obesity   OBJECTIVE:  BP 102/70 (BP Location: Left Arm, Patient Position: Sitting, Cuff Size: Normal)   Pulse 71   Temp 98.2 F (36.8 C) (Oral)   Ht 5\' 1"  (1.549 m)   Wt 151 lb (68.5 kg)   LMP 01/09/2023   SpO2 97%   Breastfeeding No   BMI 28.53 kg/m    Physical Exam Vitals reviewed.  Constitutional:      General: She is not in acute distress.    Appearance: Normal appearance. She is obese. She is not ill-appearing, toxic-appearing or diaphoretic.  Eyes:     General:        Right eye: No discharge.        Left eye: No discharge.     Conjunctiva/sclera: Conjunctivae normal.  Cardiovascular:     Rate and Rhythm: Normal rate and regular rhythm.     Heart sounds: Normal heart sounds.  Pulmonary:     Effort: Pulmonary effort is normal.     Breath sounds: Normal breath sounds.  Musculoskeletal:        General: Normal range of motion.  Skin:    General: Skin is warm and dry.  Neurological:     Mental Status: She is alert and oriented to person, place, and time. Mental status is at baseline.  Psychiatric:        Mood and Affect: Mood normal.        Behavior: Behavior normal.        Thought Content: Thought content normal.        Judgment: Judgment normal.     ASSESSMENT/PLAN:  Class 1 obesity Assessment & Plan: Chronic.  Doing well on current dose of Phentermine.  Suspect PCOS history  challenging for weight loss.  Weight decreased 17 lbs since last visit Monitoring diet and increased activity.  Increase protein intake. Continue phentermine 37.5 mg daily x 90 tablets.  Medication holiday after this. Discussed that medication not for long term use and to continue to form healthy lifestyle habits for long term success.  Patient agreeable to plan. Follow-up in 3 months   Orders: -     Phentermine HCl; Take 1 capsule (37.5 mg total) by mouth every morning.  Dispense: 90 capsule; Refill: 0    PDMP reviewed  Return in about 3 months (around 04/27/2023) for PCP.  [name], MD

## 2023-02-06 ENCOUNTER — Encounter: Payer: Self-pay | Admitting: Family Medicine

## 2023-02-06 NOTE — Assessment & Plan Note (Signed)
Chronic.  Doing well on current dose of Phentermine.  Suspect PCOS history challenging for weight loss.  Weight decreased 17 lbs since last visit Monitoring diet and increased activity.  Increase protein intake. Continue phentermine 37.5 mg daily x 90 tablets.  Medication holiday after this. Discussed that medication not for long term use and to continue to form healthy lifestyle habits for long term success.  Patient agreeable to plan. Follow-up in 3 months

## 2023-03-01 ENCOUNTER — Ambulatory Visit: Payer: 59 | Admitting: Dermatology

## 2023-03-01 DIAGNOSIS — Z79899 Other long term (current) drug therapy: Secondary | ICD-10-CM

## 2023-03-01 DIAGNOSIS — L811 Chloasma: Secondary | ICD-10-CM

## 2023-03-01 DIAGNOSIS — L578 Other skin changes due to chronic exposure to nonionizing radiation: Secondary | ICD-10-CM

## 2023-03-01 DIAGNOSIS — L814 Other melanin hyperpigmentation: Secondary | ICD-10-CM | POA: Diagnosis not present

## 2023-03-01 DIAGNOSIS — Z7189 Other specified counseling: Secondary | ICD-10-CM

## 2023-03-01 NOTE — Patient Instructions (Signed)

## 2023-03-01 NOTE — Progress Notes (Signed)
Follow-Up Visit   Subjective  Kimberly Holder is a 42 y.o. female who presents for the following: Melasma follow-up. She is using topical hydroquinone with fair results.  The patient has spots, moles and lesions to be evaluated, some may be new or changing and the patient may have concern these could be cancer.   The following portions of the chart were reviewed this encounter and updated as appropriate: medications, allergies, medical history  Review of Systems:  No other skin or systemic complaints except as noted in HPI or Assessment and Plan.  Objective  Well appearing patient in no apparent distress; mood and affect are within normal limits.  A focused examination was performed of the following areas:face, neck    Relevant exam findings are noted in the Assessment and Plan.          Assessment & Plan   MELASMA Exam: reticulated hyperpigmented patches at face  Melasma is a chronic; persistent condition of hyperpigmented patches generally on the face, worse in summer due to higher UV exposure.    Heredity; thyroid disease; sun exposure; pregnancy; birth control pills; epilepsy medication and darker skin may predispose to Melasma.   Recommendations include: - Sun avoidance and daily broad spectrum (UVA/UVB) tinted mineral sunscreen SPF 30+, with Zinc or Titanium Dioxide. - Rx topical bleaching creams (i.e. hydroquinone) is a common treatment but should not be used long term.  Hydroquinones may be mixed with retinoids; vitamin C; steroids; Kojic Acid. - Alastin A-luminate, retinoids, vitamin C, topical tranexamic acid, glycolic acid and kojic acid can be used for brightening while on break from hydroquinone - Rx Azelaic Acid is also a treatment option that is safe for pregnancy (Category B). - OTC Heliocare can be helpful in control and prevention. - Oral Rx with Tranexamic Acid 250 mg - 650 mg po daily can be used for moderate to severe cases especially during summer  (contraindications include pregnancy; lactation; hx of PE; hx of DVT; clotting disorder; heart disease; anticoagulant use and upcoming long trips)   - Chemical peels (would need multiple for best result).  - Lasers and  Microdermabrasion may also be helpful adjunct treatments.  Treatment Plan: Contraindications include pregnancy; lactation; hx of PE; DVT; clotting disorder; heart disease; anticoagulant use and upcoming long trips, discussed with patient patient denies all.   Start Tranexamic Acid 650 take 1 tablet once a day  Long term medication management.  Patient is using long term (months to years) prescription medication  to control their dermatologic condition.  These medications require periodic monitoring to evaluate for efficacy and side effects and may require periodic laboratory monitoring.  LENTIGINES Exam: scattered tan macules Due to sun exposure Treatment Plan: Benign-appearing, observe. Recommend daily broad spectrum sunscreen SPF 30+ to sun-exposed areas, reapply every 2 hours as needed.  Call for any changes  ACTINIC DAMAGE - chronic, secondary to cumulative UV radiation exposure/sun exposure over time - diffuse scaly erythematous macules with underlying dyspigmentation - Recommend daily broad spectrum sunscreen SPF 30+ to sun-exposed areas, reapply every 2 hours as needed.  - Recommend staying in the shade or wearing long sleeves, sun glasses (UVA+UVB protection) and wide brim hats (4-inch brim around the entire circumference of the hat). - Call for new or changing lesions.  Melasma  Actinic skin damage  Lentigo  Medication management  Counseling and coordination of care    Return in about 5 months (around 08/01/2023) for Melasma .  Cipriano Bunker, CMA, am acting as scribe  for Armida Sans, MD .   Documentation: I have reviewed the above documentation for accuracy and completeness, and I agree with the above.  Armida Sans, MD

## 2023-03-02 MED ORDER — TRANEXAMIC ACID 650 MG PO TABS: ORAL_TABLET | ORAL | 2 refills | Status: DC

## 2023-03-03 ENCOUNTER — Encounter: Payer: Self-pay | Admitting: Dermatology

## 2023-04-30 NOTE — Progress Notes (Unsigned)
   SUBJECTIVE:  No chief complaint on file.  HPI ***  PERTINENT PMH / PSH: ***  OBJECTIVE:  There were no vitals taken for this visit.   Physical Exam     01/25/2023    8:16 AM 10/27/2022    3:41 PM 08/08/2022    8:12 AM 05/05/2022   11:31 AM 04/27/2021    8:33 AM  Depression screen PHQ 2/9  Decreased Interest 0 0 0 0 0  Down, Depressed, Hopeless 0 0 0 0 0  PHQ - 2 Score 0 0 0 0 0  Altered sleeping   0    Tired, decreased energy   0    Change in appetite   0    Feeling bad or failure about yourself    0    Trouble concentrating   0    Moving slowly or fidgety/restless   0    Suicidal thoughts   0    PHQ-9 Score   0    Difficult doing work/chores   Not difficult at all        01/25/2023    8:16 AM 08/08/2022    8:13 AM 04/24/2020    3:15 PM  GAD 7 : Generalized Anxiety Score  Nervous, Anxious, on Edge 0 0 0  Control/stop worrying 0 0 0  Worry too much - different things 0 0 0  Trouble relaxing 0 0 0  Restless 0 0 0  Easily annoyed or irritable 0 0 0  Afraid - awful might happen 0 0 0  Total GAD 7 Score 0 0 0  Anxiety Difficulty  Not difficult at all Not difficult at all    ASSESSMENT/PLAN:  Class 1 obesity  Vitamin D deficiency   PDMP reviewed***  No follow-ups on file.  [name], MD

## 2023-04-30 NOTE — Patient Instructions (Incomplete)
It was a pleasure meeting you today. Thank you for allowing me to take part in your health care.  Our goals for today as we discussed include:  Stop Phentermine  Recommend continue to work on healthy lifestyle.  Congratulations on your success in weight loss.    We will get some labs today.  If they are abnormal or we need to do something about them, I will call you.  If they are normal, I will send you a message on MyChart (if it is active) or a letter in the mail.  If you don't hear from [place] in 2 weeks, please call the office at the number below.   Recommend looking into to following  Look at [facility] website to see if this may be an option for you with your weight loss. [facility] [address] [place] [phone]    [facility] sky MD [address] [place], [place], [place] [address] Phone: [phone]   This is a list of the screening recommended for you and due dates:  Health Maintenance  Topic Date Due   COVID-19 Vaccine (1) Never done   Flu Shot  Never done   Mammogram  08/29/2024   Pap with HPV screening  06/10/2027   DTaP/Tdap/Td vaccine (3 - Td or Tdap) 11/01/2031   Hepatitis C Screening  Completed   HIV Screening  Completed   HPV Vaccine  Aged Out    Follow up as needed  If you have any questions or concerns, please do not hesitate to call the office at [phone].  I look forward to our next visit and until then take care and stay safe.  Regards,   [name], MD   [facility]

## 2023-05-01 ENCOUNTER — Encounter: Payer: Self-pay | Admitting: Family Medicine

## 2023-05-01 ENCOUNTER — Ambulatory Visit (INDEPENDENT_AMBULATORY_CARE_PROVIDER_SITE_OTHER): Payer: 59 | Admitting: Family Medicine

## 2023-05-01 VITALS — BP 112/72 | HR 77 | Temp 98.4°F | Resp 16 | Ht 61.0 in | Wt 151.2 lb

## 2023-05-01 DIAGNOSIS — E559 Vitamin D deficiency, unspecified: Secondary | ICD-10-CM | POA: Diagnosis not present

## 2023-05-01 DIAGNOSIS — E785 Hyperlipidemia, unspecified: Secondary | ICD-10-CM | POA: Diagnosis not present

## 2023-05-01 DIAGNOSIS — E282 Polycystic ovarian syndrome: Secondary | ICD-10-CM | POA: Diagnosis not present

## 2023-05-01 DIAGNOSIS — E66811 Obesity, class 1: Secondary | ICD-10-CM

## 2023-05-01 LAB — LIPID PANEL
Cholesterol: 172 mg/dL (ref 0–200)
HDL: 60.6 mg/dL (ref >39.00)
LDL Cholesterol: 105 mg/dL — ABNORMAL HIGH (ref 0–99)
NonHDL: 111.13
Total CHOL/HDL Ratio: 3
Triglycerides: 33 mg/dL (ref 0.0–149.0)
VLDL: 6.6 mg/dL (ref 0.0–40.0)

## 2023-05-01 LAB — CBC
HCT: 43.6 % (ref 36.0–46.0)
Hemoglobin: 14.2 g/dL (ref 12.0–15.0)
MCHC: 32.5 g/dL (ref 30.0–36.0)
MCV: 89.3 fL (ref 78.0–100.0)
Platelets: 234 10*3/uL (ref 150.0–400.0)
RBC: 4.89 Mil/uL (ref 3.87–5.11)
RDW: 12.7 % (ref 11.5–15.5)
WBC: 5.5 10*3/uL (ref 4.0–10.5)

## 2023-05-01 LAB — HEMOGLOBIN A1C: Hgb A1c MFr Bld: 5.3 % (ref 4.6–6.5)

## 2023-05-01 LAB — COMPREHENSIVE METABOLIC PANEL
ALT: 13 U/L (ref 0–35)
AST: 14 U/L (ref 0–37)
Albumin: 4.3 g/dL (ref 3.5–5.2)
Alkaline Phosphatase: 65 U/L (ref 39–117)
BUN: 13 mg/dL (ref 6–23)
CO2: 27 meq/L (ref 19–32)
Calcium: 9.2 mg/dL (ref 8.4–10.5)
Chloride: 103 meq/L (ref 96–112)
Creatinine, Ser: 0.79 mg/dL (ref 0.40–1.20)
GFR: 91.93 mL/min (ref >60.00)
Glucose, Bld: 84 mg/dL (ref 70–99)
Potassium: 4 meq/L (ref 3.5–5.1)
Sodium: 138 meq/L (ref 135–145)
Total Bilirubin: 0.6 mg/dL (ref 0.2–1.2)
Total Protein: 7.1 g/dL (ref 6.0–8.3)

## 2023-05-01 LAB — VITAMIN D 25 HYDROXY (VIT D DEFICIENCY, FRACTURES): VITD: 42.82 ng/mL (ref 30.00–100.00)

## 2023-05-01 NOTE — Assessment & Plan Note (Signed)
Successful weight loss of approximately 30 pounds through lifestyle modifications and use of phentermine. Discussed the importance of maintaining lifestyle changes for long-term weight management.  -Continue current exercise and dietary habits. -Consider using remaining phentermine sparingly. Will discontinue current prescription. -Consider consultation with [facility] or [facility] MD for further weight management support. Resources provided

## 2023-05-01 NOTE — Assessment & Plan Note (Signed)
Discussed potential benefits of metformin for weight loss due to insulin resistance associated with PCOS. -Consider metformin for weight management in the context of PCOS.

## 2023-05-16 ENCOUNTER — Telehealth: Payer: Self-pay

## 2023-05-16 NOTE — Telephone Encounter (Signed)
Left message to return call to our office.  Form is ready for pick up in the front office. Form has also been faxed to the company.

## 2023-05-16 NOTE — Telephone Encounter (Signed)
Patient just called and I told her her forms were ready. She said she will come get them this week.

## 2023-06-19 NOTE — Progress Notes (Unsigned)
PCP:  Kimberly Allan, MD   No chief complaint on file.    HPI:      Ms. Kimberly Holder is a 42 y.o. [phone] whose LMP was No LMP recorded., presents today for her annual examination.  Her menses are regular every 28-30 days, lasting 5-6 days off OCPs. Hx of PCOS in past and did OCPs for cycles.  Dysmenorrhea none. She does not have intermenstrual bleeding. Gets headache before menses.   Sex activity: single partner, contraception - TL. No pain/bleeding Last Pap: 06/09/22 Results were NILM/neg HPV DNA. 07/27/20 Results were LGSIL, neg HPV DNA. Repeat due in 1 yr/ today 07/26/19  Results were: no abnormalities /neg HPV DNA; hx of ASCUS paps and pos HPV DNA since 2014; s/p mult colpos Hx of STDs: HPV  Last mammogram: 08/29/22  Results were: normal--routine follow-up in 12 months There is no FH of breast cancer. There is no FH of ovarian cancer. The patient does do self-breast exams.  Tobacco use: none Alcohol use: none No drug use.  Exercise: vmod active  She does get adequate calcium and Vitamin D in her diet.  Labs with PCP. TSH followed due to FH hypothyroidism in her mom.    Past Medical History:  Diagnosis Date   Advanced maternal age in multigravida 04/13/2021   Anemia 05/21/2022   Atypical squamous cells of undetermined significance (ASCUS) on Papanicolaou smear of cervix    Cervical high risk HPV (human papillomavirus) test positive 2014   COVID-19 06/23/2019   x 2 from 2020 to 2022   Encounter for sterilization    History of 2 cesarean sections 04/13/2021   OB/GYN  Counseling Note     42 y.o. [id number] at [redacted]w[redacted]d with Estimated Date of Delivery: 11/04/21 was seen today in office to discuss trial of labor after cesarean section (TOLAC) versus elective repeat cesarean delivery (ERCD). The following risks were discussed with the patient.     Risk of uterine rupture at term is 0.78 percent with TOLAC and 0.22 percent with ERCD. 1 in 10 uterine ruptures will re   Hx of preeclampsia,  prior pregnancy, currently pregnant 04/13/2021   Large for gestational age fetus affecting management of mother 09/02/2021   Obesity (BMI 30-39.9)    Overweight (BMI 25.0-29.9) 04/24/2020   PCOS (polycystic ovarian syndrome)    Polyhydramnios affecting pregnancy in third trimester 08/26/2021   Preeclampsia    during pregnancy   Preventative health care 12/31/2015   S/P cesarean section 10/29/2021   Supervision of high risk pregnancy, antepartum 04/13/2021          Nursing Staff  Provider  Office Location   [facility]  Dating   L=11  Language   English  Anatomy US      Flu Vaccine      Genetic Screen   NIPS: nml XY  TDaP vaccine      Hgb A1C or   GTT  Early : BMI 28 pre pregnancy.   A1C 4.9  Third trimester : 136  Covid        LAB RESULTS   Rhogam      Blood Type    A+  Feeding Plan  Breast  Antibody   negative  Contraception  BTL  Rubella   immune  Cir    Past Surgical History:  Procedure Laterality Date   CESAREAN SECTION  01/26/2006   CESAREAN SECTION  08/10/2011   CESAREAN SECTION WITH BILATERAL TUBAL LIGATION Bilateral 10/29/2021   Procedure: CESAREAN  SECTION WITH BILATERAL TUBAL LIGATION;  Surgeon: Catalina Antigua, MD;  Location: [facility] ORS;  Service: Obstetrics;  Laterality: Bilateral;   COLPOSCOPY W/ BIOPSY / CURETTAGE  08/01/2014   BX Neg   TONSILLECTOMY AND ADENOIDECTOMY  1996    Family History  Problem Relation Age of Onset   Arthritis Mother    Diabetes Mother        also had a pituitary adenoma   Endocrine tumor Mother        cancerous pituitary tumor per pt    Cancer Mother 4       brain, lung cancer   Hypertension Father    Other Father        benign "neck" tumor   Arthritis Brother    Diabetes Maternal Grandmother    Heart disease Maternal Grandmother    Breast cancer Neg Hx     Social History   Socioeconomic History   Marital status: Married    Spouse name: Kimberly Holder "Thayer Ohm"   Number of children: 2   Years of education: Not on file   Highest  education level: Not on file  Occupational History   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  Tobacco Use   Smoking status: Never   Smokeless tobacco: Never  Vaping Use   Vaping status: Never Used  Substance and Sexual Activity   Alcohol use: No    Alcohol/week: 0.0 standard drinks of alcohol   Drug use: No   Sexual activity: Yes    Birth control/protection: Surgical    Comment: Tubal Ligation  Other Topics Concern   Not on file  Social History Narrative   Married husband Thayer Ohm   2 children (1 with husband son, daughter with former partner and step daughter)   04/27/21 pregnant with boy Kimberly Holder   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   Enjoys spending time with family.   Never smoker    Social Drivers of Corporate investment banker Strain: Not on file  Food Insecurity: Not on file  Transportation Needs: Not on file  Physical Activity: Insufficiently Active (06/21/2017)   Exercise Vital Sign    Days of Exercise per Week: 6 days    Minutes of Exercise per Session: 20 min  Stress: No Stress Concern Present (06/21/2017)   [employer] of Occupational Health - Occupational Stress Questionnaire    Feeling of Stress : Not at all  Social Connections: Somewhat Isolated (06/21/2017)   Social Connection and Isolation Panel [NHANES]    Frequency of Communication with Friends and Family: More than three times a week    Frequency of Social Gatherings with Friends and Family: Three times a week    Attends Religious Services: Never    Active Member of Clubs or Organizations: No    Attends Banker Meetings: Never    Marital Status: Married  Catering manager Violence: Not At Risk (06/21/2017)   Humiliation, Afraid, Rape, and Kick questionnaire    Fear of Current or Ex-Partner: No    Emotionally Abused: No    Physically Abused: No    Sexually Abused: No     Current Outpatient Medications:    cholecalciferol (VITAMIN D3) 25 MCG (1000 UNIT) tablet, Take  2,000 Units by mouth daily., Disp: , Rfl:    Multiple Vitamin (MULTIVITAMIN) tablet, Take 1 tablet by mouth daily., Disp: , Rfl:    tranexamic acid (LYSTEDA) 650 MG TABS tablet, Take 1 tablet once a day, Disp: 30 tablet, Rfl: 2  ROS:  Review of Systems  Constitutional:  Negative for fatigue, fever and unexpected weight change.  Respiratory:  Negative for cough, shortness of breath and wheezing.   Cardiovascular:  Negative for chest pain, palpitations and leg swelling.  Gastrointestinal:  Negative for blood in stool, constipation, diarrhea, nausea and vomiting.  Endocrine: Negative for cold intolerance, heat intolerance and polyuria.  Genitourinary:  Negative for dyspareunia, dysuria, flank pain, frequency, genital sores, hematuria, menstrual problem, pelvic pain, urgency, vaginal bleeding, vaginal discharge and vaginal pain.  Musculoskeletal:  Negative for back pain, joint swelling and myalgias.  Skin:  Negative for rash.  Neurological:  Negative for dizziness, syncope, light-headedness, numbness and headaches.  Hematological:  Negative for adenopathy.  Psychiatric/Behavioral:  Negative for agitation, confusion, sleep disturbance and suicidal ideas. The patient is not nervous/anxious.   BREAST: No symptoms   Objective: There were no vitals taken for this visit.   Physical Exam Constitutional:      Appearance: She is well-developed.  Genitourinary:     Vulva normal.     Right Labia: No rash, tenderness or lesions.    Left Labia: No tenderness, lesions or rash.    No vaginal discharge, erythema or tenderness.      Right Adnexa: not tender and no mass present.    Left Adnexa: not tender and no mass present.    No cervical friability or polyp.     Uterus is not enlarged or tender.  Breasts:    Right: No mass, nipple discharge, skin change or tenderness.     Left: No mass, nipple discharge, skin change or tenderness.  Neck:     Thyroid: No thyromegaly.  Cardiovascular:      Rate and Rhythm: Normal rate and regular rhythm.     Heart sounds: Normal heart sounds. No murmur heard. Pulmonary:     Effort: Pulmonary effort is normal.     Breath sounds: Normal breath sounds.  Abdominal:     Palpations: Abdomen is soft.     Tenderness: There is no abdominal tenderness. There is no guarding or rebound.  Musculoskeletal:        General: Normal range of motion.     Cervical back: Normal range of motion.  Lymphadenopathy:     Cervical: No cervical adenopathy.  Neurological:     General: No focal deficit present.     Mental Status: She is alert and oriented to person, place, and time.     Cranial Nerves: No cranial nerve deficit.  Skin:    General: Skin is warm and dry.  Psychiatric:        Mood and Affect: Mood normal.        Behavior: Behavior normal.        Thought Content: Thought content normal.        Judgment: Judgment normal.  Vitals reviewed.     Assessment/Plan: Encounter for annual routine gynecological examination  Cervical cancer screening - Plan: Cytology - PAP  Screening for HPV (human papillomavirus) - Plan: Cytology - PAP  LGSIL on Pap smear of cervix - Plan: Cytology - PAP; repeat pap today. Will f/u with results.   Encounter for screening mammogram for malignant neoplasm of breast - Plan: MM 3D SCREEN BREAST BILATERAL; pt to schedule mammo (hasn't breastfed in over 6 months)        GYN counsel breast self exam, mammography screening, adequate intake of calcium and vitamin D, diet and exercise     F/U  No follow-ups on file.  Denea Cheaney B. Valmore Arabie, PA-C 06/19/2023 1:10 PM

## 2023-06-21 ENCOUNTER — Other Ambulatory Visit (HOSPITAL_COMMUNITY)
Admission: RE | Admit: 2023-06-21 | Discharge: 2023-06-21 | Disposition: A | Discharge status: Home / Self Care | Payer: 59 | Source: Ambulatory Visit | Attending: Obstetrics and Gynecology | Admitting: Obstetrics and Gynecology

## 2023-06-21 ENCOUNTER — Ambulatory Visit (INDEPENDENT_AMBULATORY_CARE_PROVIDER_SITE_OTHER): Payer: 59 | Admitting: Obstetrics and Gynecology

## 2023-06-21 ENCOUNTER — Encounter: Payer: Self-pay | Admitting: Obstetrics and Gynecology

## 2023-06-21 VITALS — BP 100/60 | Ht 61.0 in | Wt 165.0 lb

## 2023-06-21 DIAGNOSIS — Z01419 Encounter for gynecological examination (general) (routine) without abnormal findings: Secondary | ICD-10-CM | POA: Diagnosis not present

## 2023-06-21 DIAGNOSIS — Z124 Encounter for screening for malignant neoplasm of cervix: Secondary | ICD-10-CM | POA: Diagnosis present

## 2023-06-21 DIAGNOSIS — Z1231 Encounter for screening mammogram for malignant neoplasm of breast: Secondary | ICD-10-CM

## 2023-06-21 DIAGNOSIS — Z1151 Encounter for screening for human papillomavirus (HPV): Secondary | ICD-10-CM

## 2023-06-21 DIAGNOSIS — R87612 Low grade squamous intraepithelial lesion on cytologic smear of cervix (LGSIL): Secondary | ICD-10-CM | POA: Insufficient documentation

## 2023-06-21 NOTE — Patient Instructions (Addendum)
I value your feedback and you entrusting us with your care. If you get a [facility] patient survey, I would appreciate you taking the time to let us know about your experience today. Thank you!  [facility] ([place])--[phone]  

## 2023-06-23 LAB — CYTOLOGY - PAP
Comment: NEGATIVE
Diagnosis: NEGATIVE
High risk HPV: NEGATIVE

## 2023-07-25 ENCOUNTER — Encounter: Payer: Self-pay | Admitting: Dermatology

## 2023-07-25 ENCOUNTER — Ambulatory Visit: Payer: 59 | Admitting: Dermatology

## 2023-07-25 DIAGNOSIS — Z79899 Other long term (current) drug therapy: Secondary | ICD-10-CM | POA: Diagnosis not present

## 2023-07-25 DIAGNOSIS — L811 Chloasma: Secondary | ICD-10-CM

## 2023-07-25 DIAGNOSIS — Z7189 Other specified counseling: Secondary | ICD-10-CM | POA: Diagnosis not present

## 2023-07-25 MED ORDER — HYDROQUINONE POWD: 2 refills | Status: DC

## 2023-07-25 NOTE — Progress Notes (Signed)
   Follow-Up Visit   Subjective  Kimberly Holder is a 43 y.o. female who presents for the following: Melasma - face, improves during winter months, uses Heliocare supplements during times of extensive sun exposure. Pt states that tranexamic acid caused blot clots so she stopped using it.  The following portions of the chart were reviewed this encounter and updated as appropriate: medications, allergies, medical history  Review of Systems:  No other skin or systemic complaints except as noted in HPI or Assessment and Plan.  Objective  Well appearing patient in no apparent distress; mood and affect are within normal limits.  A focused examination was performed of the following areas: the face   Relevant exam findings are noted in the Assessment and Plan.    Assessment & Plan          MELASMA Exam: reticulated hyperpigmented patches at face  Chronic and persistent condition with duration or expected duration over one year. Condition is symptomatic/ bothersome to patient. Not currently at goal.  Melasma is a chronic; persistent condition of hyperpigmented patches generally on the face, worse in summer due to higher UV exposure.    Heredity; thyroid disease; sun exposure; pregnancy; birth control pills; epilepsy medication and darker skin may predispose to Melasma.   Recommendations include: - Sun avoidance and daily broad spectrum (UVA/UVB) tinted mineral sunscreen SPF 30+, with Zinc or Titanium Dioxide. - Rx topical bleaching creams (i.e. hydroquinone) is a common treatment but should not be used long term.  Hydroquinones may be mixed with retinoids; vitamin C; steroids; Kojic Acid. - Alastin A-luminate, retinoids, vitamin C, topical tranexamic acid, glycolic acid and kojic acid can be used for brightening while on break from hydroquinone - Rx Azelaic Acid is also a treatment option that is safe for pregnancy (Category B). - OTC Heliocare can be helpful in control and  prevention. - Oral Rx with Tranexamic Acid 250 mg - 650 mg po daily can be used for moderate to severe cases especially during summer (contraindications include pregnancy; lactation; hx of PE; hx of DVT; clotting disorder; heart disease; anticoagulant use and upcoming long trips)   - Chemical peels (would need multiple for best result).  - Lasers and  Microdermabrasion may also be helpful adjunct treatments.  Treatment Plan: Continue Heliocare supplements daily. Start SM19 Hydroquinone 12% / Kojic Acid 6% / Niacinamide 2% / Vitamin C 1% Cream  Do not restart Tranexamic acid due to clotting of menstrual bleeding.   Return in about 1 year (around 07/24/2024) for melasma follow up.  Maylene Roes, CMA, am acting as scribe for Armida Sans, MD .   Documentation: I have reviewed the above documentation for accuracy and completeness, and I agree with the above.  Armida Sans, MD

## 2023-07-25 NOTE — Patient Instructions (Addendum)
Instructions for [facility] Medications  One or more of your medications was sent to the [facility] mail order compounding pharmacy. You will receive an email from them and can purchase the medicine through that link. It will then be mailed to your home at the address you confirmed. If for any reason you do not receive an email from them, please check your spam folder. If you still do not find the email, please let us know. [facility] phone number is [phone].       Due to recent changes in healthcare laws, you may see results of your pathology and/or laboratory studies on MyChart before the doctors have had a chance to review them. We understand that in some cases there may be results that are confusing or concerning to you. Please understand that not all results are received at the same time and often the doctors may need to interpret multiple results in order to provide you with the best plan of care or course of treatment. Therefore, we ask that you please give [place] 2 business days to thoroughly review all your results before contacting the office for clarification. Should we see a critical lab result, you will be contacted sooner.   If You Need Anything After Your Visit  If you have any questions or concerns for your doctor, please call our main line at [phone] and press option 4 to reach your doctor's medical assistant. If no one answers, please leave a voicemail as directed and we will return your call as soon as possible. Messages left after 4 pm will be answered the following business day.   You may also send [place] a message via MyChart. We typically respond to MyChart messages within 1-2 business days.  For prescription refills, please ask your pharmacy to contact our office. Our fax number is [phone].  If you have an urgent issue when the clinic is closed that cannot wait until the next business day, you can page your doctor at the number below.    Please note  that while we do our best to be available for urgent issues outside of office hours, we are not available 24/7.   If you have an urgent issue and are unable to reach [place], you may choose to seek medical care at your doctor's office, retail clinic, urgent care center, or emergency room.  If you have a medical emergency, please immediately call 911 or go to the emergency department.  Pager Numbers  - Dr. Gwen Pounds: [phone]  - Dr. Roseanne Reno: [phone]  - Dr. Katrinka Blazing: [phone]   In the event of inclement weather, please call our main line at [phone] for an update on the status of any delays or closures.  Dermatology Medication Tips: Please keep the boxes that topical medications come in in order to help keep track of the instructions about where and how to use these. Pharmacies typically print the medication instructions only on the boxes and not directly on the medication tubes.   If your medication is too expensive, please contact our office at [phone] option 4 or send [place] a message through MyChart.   We are unable to tell what your co-pay for medications will be in advance as this is different depending on your insurance coverage. However, we may be able to find a substitute medication at lower cost or fill out paperwork to get insurance to cover a needed medication.   If a prior authorization is required to get your medication covered by your  insurance company, please allow [place] 1-2 business days to complete this process.  Drug prices often vary depending on where the prescription is filled and some pharmacies may offer cheaper prices.  The website www.goodrx.com contains coupons for medications through different pharmacies. The prices here do not account for what the cost may be with help from insurance (it may be cheaper with your insurance), but the website can give you the price if you did not use any insurance.  - You can print the associated coupon and take it with your  prescription to the pharmacy.  - You may also stop by our office during regular business hours and pick up a GoodRx coupon card.  - If you need your prescription sent electronically to a different pharmacy, notify our office through [facility] or by phone at [phone] option 4.     Si Usted Necesita Algo Despus de Su Visita  Tambin puede enviarnos un mensaje a travs de Clinical cytogeneticist. Por lo general respondemos a los mensajes de MyChart en el transcurso de 1 a 2 das hbiles.  Para renovar recetas, por favor pida a su farmacia que se ponga en contacto con nuestra oficina. Annie Sable de fax es [place] [phone].  Si tiene un asunto urgente cuando la clnica est cerrada y que no puede esperar hasta el siguiente da hbil, puede llamar/localizar a su doctor(a) al nmero que aparece a continuacin.   Por favor, tenga en cuenta que aunque hacemos todo lo posible para estar disponibles para asuntos urgentes fuera del horario de [place], no estamos disponibles las 24 horas del da, los 7 [address]  [address] de la [place].   Si tiene un problema urgente y no puede comunicarse con nosotros, puede optar por buscar atencin mdica  en el consultorio de su doctor(a), en una clnica privada, en un centro de atencin urgente o en una sala de emergencias.  Si tiene Engineer, drilling, por favor llame inmediatamente al 911 o vaya a la sala de emergencias.  Nmeros de bper  - Dr. Gwen Pounds: [phone]  - Dra. Roseanne Reno: [phone]  - Dr. Katrinka Blazing: [phone]   En caso de inclemencias del tiempo, por favor llame a Lacy Duverney principal al [phone] para una actualizacin sobre el [place] de cualquier retraso o cierre.  Consejos para la medicacin en dermatologa: Por favor, guarde las cajas en las que vienen los medicamentos de uso tpico para ayudarle a seguir las instrucciones sobre dnde y cmo usarlos. Las farmacias generalmente imprimen las instrucciones del medicamento slo en las cajas y no  directamente en los tubos del [place].   Si su medicamento es muy caro, por favor, pngase en contacto con Rolm Gala llamando al [phone] y presione la opcin 4 o envenos un mensaje a travs de Clinical cytogeneticist.   No podemos decirle cul ser su copago por los medicamentos por adelantado ya que esto es diferente dependiendo de la cobertura de su seguro. Sin embargo, es posible que podamos encontrar un medicamento sustituto a Audiological scientist un formulario para que el seguro cubra el medicamento que se considera necesario.   Si se requiere una autorizacin previa para que su compaa de seguros [place] su medicamento, por favor permtanos de 1 a 2 das hbiles para completar [address]  Los precios de los medicamentos varan con frecuencia dependiendo del Environmental consultant de dnde se surte la receta y alguna farmacias pueden ofrecer precios ms baratos.  El sitio web www.goodrx.com tiene cupones para medicamentos de Health and safety inspector. Los precios aqu no tienen  en cuenta lo que podra costar con la ayuda del seguro (puede ser ms barato con su seguro), pero el sitio web puede darle el precio si no Visual merchandiser.  - Puede imprimir el cupn correspondiente y llevarlo con su receta a la farmacia.  - Tambin puede pasar por nuestra oficina durante el horario de atencin regular y Education officer, museum una tarjeta de cupones de GoodRx.  - Si necesita que su receta se enve electrnicamente a una farmacia diferente, informe a nuestra oficina a travs de MyChart de [place] o por telfono llamando al [phone] y presione la opcin 4.

## 2023-08-31 ENCOUNTER — Ambulatory Visit
Admission: RE | Admit: 2023-08-31 | Discharge: 2023-08-31 | Disposition: A | Discharge status: Home / Self Care | Payer: 59 | Source: Ambulatory Visit | Attending: Obstetrics and Gynecology | Admitting: Obstetrics and Gynecology

## 2023-08-31 DIAGNOSIS — Z1231 Encounter for screening mammogram for malignant neoplasm of breast: Secondary | ICD-10-CM | POA: Diagnosis present

## 2023-09-01 ENCOUNTER — Encounter: Payer: Self-pay | Admitting: Family Medicine

## 2023-09-05 ENCOUNTER — Encounter: Payer: Self-pay | Admitting: Obstetrics and Gynecology

## 2023-09-13 ENCOUNTER — Ambulatory Visit: Admitting: Family Medicine

## 2023-09-13 ENCOUNTER — Encounter: Payer: Self-pay | Admitting: Family Medicine

## 2023-09-13 VITALS — BP 110/74 | HR 67 | Temp 98.0°F | Resp 20 | Ht 61.0 in | Wt 172.0 lb

## 2023-09-13 DIAGNOSIS — Z6832 Body mass index (BMI) 32.0-32.9, adult: Secondary | ICD-10-CM

## 2023-09-13 DIAGNOSIS — E66811 Obesity, class 1: Secondary | ICD-10-CM

## 2023-09-13 MED ORDER — TOPIRAMATE 25 MG PO TABS: 25.0000 mg | ORAL_TABLET | Freq: Every day | ORAL | 0 refills | Status: DC

## 2023-09-13 MED ORDER — LOMAIRA 8 MG PO TABS: 4.0000 mg | ORAL_TABLET | Freq: Every day | ORAL | 0 refills | Status: DC

## 2023-09-13 NOTE — Progress Notes (Signed)
 SUBJECTIVE:   Chief Complaint  Patient presents with   Weight Gain   HPI Presents for follow up chronic disease management  Discussed the use of AI scribe software for clinical note transcription with the patient, who gave verbal consent to proceed.  History of Present Illness The patient presents with concerns about weight management and medication coverage.  She has experienced fluctuations in her weight, with a recent increase from 164 lbs to 172 lbs, leading to her clothes no longer fitting as she used to and necessitating the purchase of larger sizes. She is concerned about both her weight and BMI.  Her exercise routine includes walking a mile at [employer] and using various gym equipment, such as pull-down bars and leg machines. She also has a treadmill at home. She is not swimming currently due to cold weather, which she believes might be affecting her weight management.  Her dietary habits include eating chicken salad with wheat thins, cheese, and pretzels. She skipped breakfast on the day of the visit, although she usually eats breakfast, lunch, and dinner when on phentermine.  She has previously tried phentermine for weight loss. Her insurance coverage has changed, affecting the availability of these medications.     PERTINENT PMH / PSH: As above  OBJECTIVE:  BP 110/74   Pulse 67   Temp 98 F (36.7 C)   Resp 20   Ht 5\' 1"  (1.549 m)   Wt 172 lb (78 kg)   LMP 08/29/2023   SpO2 96%   Breastfeeding No   BMI 32.50 kg/m    Physical Exam Vitals reviewed.  Constitutional:      General: She is not in acute distress.    Appearance: Normal appearance. She is obese. She is not ill-appearing, toxic-appearing or diaphoretic.  Eyes:     General:        Right eye: No discharge.        Left eye: No discharge.     Conjunctiva/sclera: Conjunctivae normal.  Cardiovascular:     Rate and Rhythm: Normal rate and regular rhythm.     Heart sounds: Normal heart sounds.   Pulmonary:     Effort: Pulmonary effort is normal.     Breath sounds: Normal breath sounds.  Abdominal:     General: Bowel sounds are normal.  Musculoskeletal:        General: Normal range of motion.  Skin:    General: Skin is warm and dry.  Neurological:     General: No focal deficit present.     Mental Status: She is alert and oriented to person, place, and time. Mental status is at baseline.  Psychiatric:        Mood and Affect: Mood normal.        Behavior: Behavior normal.        Thought Content: Thought content normal.        Judgment: Judgment normal.           09/13/2023   10:17 AM 05/01/2023    8:11 AM 01/25/2023    8:16 AM 10/27/2022    3:41 PM 08/08/2022    8:12 AM  Depression screen PHQ 2/9  Decreased Interest 0 0 0 0 0  Down, Depressed, Hopeless 0 0 0 0 0  PHQ - 2 Score 0 0 0 0 0  Altered sleeping 0 0   0  Tired, decreased energy 0 0   0  Change in appetite 0 0   0  Feeling bad  or failure about yourself  0 0   0  Trouble concentrating 0 0   0  Moving slowly or fidgety/restless 0 0   0  Suicidal thoughts 0 0   0  PHQ-9 Score 0 0   0  Difficult doing work/chores Not difficult at all Not difficult at all   Not difficult at all      09/13/2023   10:17 AM 05/01/2023    8:11 AM 01/25/2023    8:16 AM 08/08/2022    8:13 AM  GAD 7 : Generalized Anxiety Score  Nervous, Anxious, on Edge 0 0 0 0  Control/stop worrying 0 0 0 0  Worry too much - different things 0 0 0 0  Trouble relaxing 0 0 0 0  Restless 0 0 0 0  Easily annoyed or irritable 0 0 0 0  Afraid - awful might happen 0 0 0 0  Total GAD 7 Score 0 0 0 0  Anxiety Difficulty Not difficult at all Not difficult at all  Not difficult at all    ASSESSMENT/PLAN:  Class 1 obesity Assessment & Plan: Discussed regular meals and cardio exercise for weight management. Considered Qsymia for weight loss, with potential insurance issues. Explained risks of yo-yo dieting with phentermine. - Order Qsymia if covered by  insurance. - Consider prescribing phentermine and topiramate separately if Qsymia is not covered. - Advise increasing cardio exercise, possibly by swimming or using an indoor pool. - Encourage regular meals with small portions throughout the day.   Orders: -     Topiramate; Take 1-3 tablets (25-75 mg total) by mouth daily. Take 25 mg, 1 tablet daily for 2 weeks.  On week 3, increase to 50 mg, 2 tablets daily for 2 weeks. On week 5, increase to 75 mg, 3 tablets for 8 weeks.  Take with Lasandra Beech daily.  Dispense: 210 tablet; Refill: 0 -     Lomaira; Take 0.5-1 tablets (4-8 mg total) by mouth daily. Take 4 mg, 1/2 tablet daily for 2 weeks.  On week 3, increase to 8 mg,1 tablet daily for 2 weeks.  On week 5 increase to 12 mg, 1.5 tablets for 8 weeks.  Take with Topamax daily.  Dispense: 105 tablet; Refill: 0    PDMP reviewed  Return in about 3 months (around 12/14/2023) for PCP.  [name], MD

## 2023-09-13 NOTE — Patient Instructions (Addendum)
 It was a pleasure meeting you today. Thank you for allowing me to take part in your health care.  Our goals for today as we discussed include:  Start both medications as prescribed  Follow up with PCP in 3 months   This is a list of the screening recommended for you and due dates:  Health Maintenance  Topic Date Due   COVID-19 Vaccine (1) Never done   Flu Shot  10/02/2023*   Mammogram  08/30/2025   Pap with HPV screening  06/20/2028   DTaP/Tdap/Td vaccine (3 - Td or Tdap) 11/01/2031   Hepatitis C Screening  Completed   HIV Screening  Completed   HPV Vaccine  Aged Out  *Topic was postponed. The date shown is not the original due date.      If you have any questions or concerns, please do not hesitate to call the office at [phone].  I look forward to our next visit and until then take care and stay safe.  Regards,   [name], MD   [facility]

## 2023-09-18 ENCOUNTER — Encounter: Payer: Self-pay | Admitting: Family Medicine

## 2023-09-18 NOTE — Assessment & Plan Note (Signed)
 Discussed regular meals and cardio exercise for weight management. Considered Qsymia for weight loss, with potential insurance issues. Explained risks of yo-yo dieting with phentermine. - Order Qsymia if covered by insurance. - Consider prescribing phentermine and topiramate separately if Qsymia is not covered. - Advise increasing cardio exercise, possibly by swimming or using an indoor pool. - Encourage regular meals with small portions throughout the day.

## 2023-11-21 ENCOUNTER — Other Ambulatory Visit: Payer: Self-pay | Admitting: Family Medicine

## 2023-11-21 ENCOUNTER — Encounter: Payer: Self-pay | Admitting: Family Medicine

## 2023-11-21 DIAGNOSIS — E66811 Obesity, class 1: Secondary | ICD-10-CM

## 2023-11-21 NOTE — Telephone Encounter (Signed)
Please advise refill? 

## 2023-12-04 NOTE — Telephone Encounter (Signed)
 I asked the patient to call her insurance carrier to see what has changed. Due to the documentation and diagnosis has not change for the last three visits except for a physical appointment. The patient wanted better understanding of it as well. She stated she will give the insurance company a call.

## 2023-12-10 NOTE — Patient Instructions (Incomplete)
 It was a pleasure meeting you today. Thank you for allowing me to take part in your health care.  Our goals for today as we discussed include:  Continue Qsymia for weight loss  Look at [employer] [facility] website to see if this may be an option for you with your weight loss. [facility] [address] [place] [phone]    This is a list of the screening recommended for you and due dates:  Health Maintenance  Topic Date Due   COVID-19 Vaccine (1) Never done   Flu Shot  02/02/2024   Mammogram  08/30/2025   Pap with HPV screening  06/20/2028   DTaP/Tdap/Td vaccine (3 - Td or Tdap) 11/01/2031   Hepatitis C Screening  Completed   HIV Screening  Completed   HPV Vaccine  Aged Out   Meningitis B Vaccine  Aged Out      If you have any questions or concerns, please do not hesitate to call the office at [phone].  I look forward to our next visit and until then take care and stay safe.  Regards,   Valli Gaw, MD   [facility]

## 2023-12-11 ENCOUNTER — Ambulatory Visit: Admitting: Family Medicine

## 2023-12-11 ENCOUNTER — Encounter: Payer: Self-pay | Admitting: Family Medicine

## 2023-12-11 VITALS — BP 116/66 | HR 79 | Temp 97.8°F | Resp 20 | Ht 61.0 in | Wt 143.0 lb

## 2023-12-11 DIAGNOSIS — E66811 Obesity, class 1: Secondary | ICD-10-CM | POA: Diagnosis not present

## 2023-12-11 DIAGNOSIS — E785 Hyperlipidemia, unspecified: Secondary | ICD-10-CM | POA: Diagnosis not present

## 2023-12-11 MED ORDER — LOMAIRA 8 MG PO TABS: 12.0000 mg | ORAL_TABLET | Freq: Every day | ORAL | 1 refills | Status: DC

## 2023-12-11 MED ORDER — TOPIRAMATE 25 MG PO TABS: 75.0000 mg | ORAL_TABLET | Freq: Every day | ORAL | 1 refills | Status: DC

## 2023-12-11 NOTE — Progress Notes (Signed)
 SUBJECTIVE:   Chief Complaint  Patient presents with   Obesity    Follow up   HPI Follow up for weight loss management  Discussed the use of AI scribe software for clinical note transcription with the patient, who gave verbal consent to proceed.  History of Present Illness Kimberly Holder is a 43 year old female with class one obesity who presents for weight loss management.  She is currently on a weight loss regimen that includes Lomaira  at a dose of one and a half tablets and Topamax  at a dose of three tablets. She experiences no side effects from these medications. Her appetite and activity levels are satisfactory.  She has lost 29 lbs since starting on Lomaria and Topiramate .   She has been diagnosed with class one obesity since the beginning of her treatment. There is a history of elevated cholesterol levels, which were noted to be slightly above normal during the last check.  She has encountered difficulties in obtaining insurance approval for certain treatments, such as Frederik Jansky, due to [employer].  She consumed cake this morning, indicating she has not been fasting for any potential blood work today.  She is employed at [employer].     PERTINENT PMH / PSH: As above  OBJECTIVE:  BP 116/66   Pulse 79   Temp 97.8 F (36.6 C)   Resp 20   Ht 5\' 1"  (1.549 m)   Wt 143 lb (64.9 kg)   LMP 12/01/2023   SpO2 99%   BMI 27.02 kg/m    Physical Exam Vitals reviewed.  Constitutional:      General: She is not in acute distress.    Appearance: Normal appearance. She is not ill-appearing, toxic-appearing or diaphoretic.  Eyes:     General:        Right eye: No discharge.        Left eye: No discharge.     Conjunctiva/sclera: Conjunctivae normal.  Cardiovascular:     Rate and Rhythm: Normal rate and regular rhythm.     Heart sounds: Normal heart sounds.  Pulmonary:     Effort: Pulmonary effort is normal.     Breath sounds: Normal breath sounds.  Abdominal:      General: Bowel sounds are normal.  Musculoskeletal:        General: Normal range of motion.  Skin:    General: Skin is warm and dry.  Neurological:     General: No focal deficit present.     Mental Status: She is alert and oriented to person, place, and time. Mental status is at baseline.  Psychiatric:        Mood and Affect: Mood normal.        Behavior: Behavior normal.        Thought Content: Thought content normal.        Judgment: Judgment normal.           12/11/2023    8:27 AM 09/13/2023   10:17 AM 05/01/2023    8:11 AM 01/25/2023    8:16 AM 10/27/2022    3:41 PM  Depression screen PHQ 2/9  Decreased Interest 0 0 0 0 0  Down, Depressed, Hopeless 0 0 0 0 0  PHQ - 2 Score 0 0 0 0 0  Altered sleeping 0 0 0    Tired, decreased energy 0 0 0    Change in appetite 0 0 0    Feeling bad or failure about yourself  0 0 0  Trouble concentrating 0 0 0    Moving slowly or fidgety/restless 0 0 0    Suicidal thoughts 0 0 0    PHQ-9 Score 0 0 0    Difficult doing work/chores Not difficult at all Not difficult at all Not difficult at all        12/11/2023    8:27 AM 09/13/2023   10:17 AM 05/01/2023    8:11 AM 01/25/2023    8:16 AM  GAD 7 : Generalized Anxiety Score  Nervous, Anxious, on Edge 0 0 0 0  Control/stop worrying 0 0 0 0  Worry too much - different things 0 0 0 0  Trouble relaxing 0 0 0 0  Restless 0 0 0 0  Easily annoyed or irritable 0 0 0 0  Afraid - awful might happen 0 0 0 0  Total GAD 7 Score 0 0 0 0  Anxiety Difficulty Not difficult at all Not difficult at all Not difficult at all     ASSESSMENT/PLAN:  Hyperlipidemia, unspecified hyperlipidemia type Assessment & Plan: Slightly elevated cholesterol levels - Check fasting lipids   Orders: -     Lipid panel; Future  Class 1 obesity Assessment & Plan: Class 1 obesity managed with Lomaira  and Topamax , effective without side effects. Exploring alternative insurance coding for medication coverage. -  Continue Lomaira  12 mg daily - Continue Topamax  75 mg daily - Message obesity specialist for alternative insurance coding options. - Schedule follow-up in 3-4 months for weight management.  Orders: -     Comprehensive metabolic panel with GFR; Future -     Lomaira ; Take 1.5 tablets (12 mg total) by mouth daily. Take with Topamax   Dispense: 135 tablet; Refill: 1 -     Topiramate ; Take 3 tablets (75 mg total) by mouth daily. Take with Lomaira  daily.  Dispense: 270 tablet; Refill: 1   PDMP reviewed  Return in about 3 months (around 03/12/2024) for PCP, Weight management.  Valli Gaw, MD

## 2023-12-11 NOTE — Assessment & Plan Note (Signed)
 Slightly elevated cholesterol levels - Check fasting lipids

## 2023-12-11 NOTE — Assessment & Plan Note (Signed)
 Class 1 obesity managed with Lomaira  and Topamax , effective without side effects. Exploring alternative insurance coding for medication coverage. - Continue Lomaira  12 mg daily - Continue Topamax  75 mg daily - Message obesity specialist for alternative insurance coding options. - Schedule follow-up in 3-4 months for weight management.

## 2023-12-12 ENCOUNTER — Telehealth: Payer: Self-pay

## 2023-12-12 ENCOUNTER — Other Ambulatory Visit (HOSPITAL_COMMUNITY): Payer: Self-pay

## 2023-12-12 NOTE — Telephone Encounter (Unsigned)
 Copied from CRM [id number]. Topic: Clinical - Medication Question >> Dec 12, 2023  3:14 PM Marlan Silva wrote: Reason for CRM: Patient states that [facility] is fine, she would still like the medication as well as the goodrx coupon. Please contact patient once medication has been sent.

## 2023-12-12 NOTE — Telephone Encounter (Signed)
 Sent pt a mychart message asking if she wants to use a good RX card.

## 2023-12-12 NOTE — Telephone Encounter (Signed)
 Pharmacy Patient Advocate Encounter   Received notification from Onbase that prior authorization for Lomaira  8 mg tablets is required/requested.   Insurance verification completed.   The patient is insured through [facility] .   Per test claim: Please consider changing therapies or patient can use good rx to pay out of pocket

## 2023-12-13 ENCOUNTER — Other Ambulatory Visit (INDEPENDENT_AMBULATORY_CARE_PROVIDER_SITE_OTHER)

## 2023-12-13 DIAGNOSIS — E66811 Obesity, class 1: Secondary | ICD-10-CM | POA: Diagnosis not present

## 2023-12-13 DIAGNOSIS — E785 Hyperlipidemia, unspecified: Secondary | ICD-10-CM

## 2023-12-14 ENCOUNTER — Ambulatory Visit: Payer: Self-pay | Admitting: Family Medicine

## 2023-12-14 LAB — COMPREHENSIVE METABOLIC PANEL WITH GFR
ALT: 14 IU/L (ref 0–32)
AST: 16 IU/L (ref 0–40)
Albumin: 4.3 g/dL (ref 3.9–4.9)
Alkaline Phosphatase: 68 IU/L (ref 44–121)
BUN/Creatinine Ratio: 9 (ref 9–23)
BUN: 7 mg/dL (ref 6–24)
Bilirubin Total: 0.3 mg/dL (ref 0.0–1.2)
CO2: 23 mmol/L (ref 20–29)
Calcium: 8.7 mg/dL (ref 8.7–10.2)
Chloride: 105 mmol/L (ref 96–106)
Creatinine, Ser: 0.81 mg/dL (ref 0.57–1.00)
Globulin, Total: 2.5 g/dL (ref 1.5–4.5)
Glucose: 93 mg/dL (ref 70–99)
Potassium: 3.8 mmol/L (ref 3.5–5.2)
Sodium: 140 mmol/L (ref 134–144)
Total Protein: 6.8 g/dL (ref 6.0–8.5)
eGFR: 92 mL/min/{1.73_m2} (ref >59)

## 2023-12-14 LAB — LIPID PANEL
Chol/HDL Ratio: 2.7 ratio (ref 0.0–4.4)
Cholesterol, Total: 154 mg/dL (ref 100–199)
HDL: 58 mg/dL (ref >39)
LDL Chol Calc (NIH): 86 mg/dL (ref 0–99)
Triglycerides: 43 mg/dL (ref 0–149)
VLDL Cholesterol Cal: 10 mg/dL (ref 5–40)

## 2024-03-14 ENCOUNTER — Ambulatory Visit: Admitting: Nurse Practitioner

## 2024-03-29 ENCOUNTER — Encounter

## 2024-05-02 ENCOUNTER — Encounter: Payer: Self-pay | Admitting: Family Medicine

## 2024-05-10 ENCOUNTER — Ambulatory Visit: Admitting: Nurse Practitioner

## 2024-05-10 VITALS — BP 116/76 | HR 83 | Temp 97.7°F | Ht 61.0 in | Wt 143.4 lb

## 2024-05-10 DIAGNOSIS — E559 Vitamin D deficiency, unspecified: Secondary | ICD-10-CM | POA: Diagnosis not present

## 2024-05-10 DIAGNOSIS — E663 Overweight: Secondary | ICD-10-CM | POA: Diagnosis not present

## 2024-05-10 DIAGNOSIS — E282 Polycystic ovarian syndrome: Secondary | ICD-10-CM | POA: Diagnosis not present

## 2024-05-10 DIAGNOSIS — E785 Hyperlipidemia, unspecified: Secondary | ICD-10-CM

## 2024-05-10 NOTE — Assessment & Plan Note (Signed)
 Obesity management in the context of PCOS. Previous medications included phentermine  and Topamax , but insurance limits options. Discussed referral to a wellness clinic and the potential use of metformin for PCOS and weight loss. Advised I am not comfortable prescribing and managing phentermine . Consider discontinuing current medications to assess weight maintenance. Referred to the [facility] group for support. Consider metformin for PCOS. Continue healthy diet and regular exercise.

## 2024-05-10 NOTE — Assessment & Plan Note (Signed)
 Managed with healthy diet and regular exercise. Continue. The 10-year ASCVD risk score (Arnett DK, et al., 2019) is: 0.3%.

## 2024-05-10 NOTE — Assessment & Plan Note (Signed)
 Taking daily OTC supplement. Continue vitamin D  supplementation.

## 2024-05-10 NOTE — Assessment & Plan Note (Signed)
 PCOS presents with frequent menstruation. Discussed metformin for management and weight loss. Emphasized diet and exercise to manage PCOS and prevent diabetes. Consider metformin for PCOS management and encourage diet and exercise. Follow up with ObGyn.

## 2024-05-10 NOTE — Progress Notes (Signed)
 Leron Glance, NP-C Phone: [phone]  Kimberly Holder is a 43 y.o. female who presents today for transfer of care.   Discussed the use of AI scribe software for clinical note transcription with the patient, who gave verbal consent to proceed.  History of Present Illness   Kimberly Holder is a 43 year old female with PCOS who presents for transfer of care.  She has a history of polycystic ovary syndrome (PCOS) diagnosed several years ago. Initially, she was placed on birth control to manage symptoms, but this led to weight gain and an unintended pregnancy. Currently, she is not on birth control and experiences menstrual cycles every two and a half to three weeks, which she finds inconvenient but not painful.  She is currently taking a combination of Topamax  and Lomaira  (phentermine ). Her weight is stable at around 140 pounds, which she considers ideal for her height, although she acknowledges that her ideal weight range is 105 to 131 pounds. She has previously reached a weight of 138 pounds in June. She is concerned about potential side effects of her current medications, including memory issues, but is unsure if these are related to the medication or other factors.  She has a family history of thyroid issues and cancer, with her mother having had multiple cancer diagnoses before passing away in 2022. She is proactive about her health, attending annual check-ups and ensuring her daughter also receives regular medical care.  She has a history of high cholesterol, but it is not currently high enough to require medication. She takes a vitamin D3 supplement due to previous concerns about low levels.  In terms of social history, she is active, working at [employer] and managing a household with three children, including an 59 year old, a 18 year old, and a 52-year-old. She used to swim regularly until mid-October, which she notes helped maintain her muscle mass. She is concerned about maintaining her  weight and health through the holiday season.      Social History   Tobacco Use  Smoking Status Never  Smokeless Tobacco Never    Current Outpatient Medications on File Prior to Visit  Medication Sig Dispense Refill   cholecalciferol (VITAMIN D3) 25 MCG (1000 UNIT) tablet Take 2,000 Units by mouth daily.     Multiple Vitamin (MULTIVITAMIN) tablet Take 1 tablet by mouth daily.     topiramate  (TOPAMAX ) 25 MG tablet Take 3 tablets (75 mg total) by mouth daily. Take with Lomaira  daily. 270 tablet 1   Phentermine  HCl (LOMAIRA ) 8 MG TABS Take 1.5 tablets (12 mg total) by mouth daily. Take with Topamax  135 tablet 1   No current facility-administered medications on file prior to visit.     ROS see history of present illness  Objective  Physical Exam Vitals:   05/10/24 0827  BP: 116/76  Pulse: 83  Temp: 97.7 F (36.5 C)  SpO2: 99%    BP Readings from Last 3 Encounters:  05/10/24 116/76  12/11/23 116/66  09/13/23 110/74   Wt Readings from Last 3 Encounters:  05/10/24 143 lb 6.4 oz (65 kg)  12/11/23 143 lb (64.9 kg)  09/13/23 172 lb (78 kg)    Physical Exam Constitutional:      General: She is not in acute distress.    Appearance: Normal appearance.  HENT:     Head: Normocephalic.  Cardiovascular:     Rate and Rhythm: Normal rate and regular rhythm.     Heart sounds: Normal heart sounds.  Pulmonary:  Effort: Pulmonary effort is normal.     Breath sounds: Normal breath sounds.  Skin:    General: Skin is warm and dry.  Neurological:     General: No focal deficit present.     Mental Status: She is alert.  Psychiatric:        Mood and Affect: Mood normal.        Behavior: Behavior normal.      Assessment/Plan: Please see individual problem list.  Overweight (BMI 25.0-29.9) Assessment & Plan: Obesity management in the context of PCOS. Previous medications included phentermine  and Topamax , but insurance limits options. Discussed referral to a wellness  clinic and the potential use of metformin for PCOS and weight loss. Advised I am not comfortable prescribing and managing phentermine . Consider discontinuing current medications to assess weight maintenance. Referred to the Healthy Weight [facility] Wellness group for support. Consider metformin for PCOS. Continue healthy diet and regular exercise.   Orders: -     Amb Ref to Medical Weight Management  PCOS (polycystic ovarian syndrome) Assessment & Plan: PCOS presents with frequent menstruation. Discussed metformin for management and weight loss. Emphasized diet and exercise to manage PCOS and prevent diabetes. Consider metformin for PCOS management and encourage diet and exercise. Follow up with ObGyn.   Hyperlipidemia, unspecified hyperlipidemia type Assessment & Plan: Managed with healthy diet and regular exercise. Continue. The 10-year ASCVD risk score (Arnett DK, et al., 2019) is: 0.3%.   Vitamin D  deficiency Assessment & Plan: Taking daily OTC supplement. Continue vitamin D  supplementation.       Return in about 1 year (around 05/10/2025) for Annual Exam, sooner as needed.   Leron Glance, NP-C [facility]

## 2024-06-05 NOTE — Addendum Note (Signed)
 Addended by: GRETEL APP on: 06/05/2024 11:18 AM   Modules accepted: Orders

## 2024-06-10 ENCOUNTER — Other Ambulatory Visit: Payer: Self-pay

## 2024-06-10 DIAGNOSIS — E66811 Obesity, class 1: Secondary | ICD-10-CM

## 2024-06-10 MED ORDER — TOPIRAMATE 25 MG PO TABS: 75.0000 mg | ORAL_TABLET | Freq: Every day | ORAL | 3 refills | Status: AC

## 2024-06-21 NOTE — Progress Notes (Unsigned)
 "   Gynecology Annual Exam  PCP: Gretel App, NP  Chief Complaint: No chief complaint on file.   History of Present Illness: Patient is a 43 y.o. H4E6976 presents for annual exam. The patient has no complaints today.   LMP: No LMP recorded. Average Interval: {Desc; regular/irreg:14544}, {numbers 22-35:14824} days Duration of flow: {numbers; 0-10:33138} days Heavy Menses: {yes/no:63} Clots: {yes/no:63} Intermenstrual Bleeding: {yes/no:63} Postcoital Bleeding: {yes/no:63} Dysmenorrhea: {yes/no:63}   The patient {sys sexually active:13135} sexually active. She currently uses {method:5051} for contraception. She {has/denies:315300} dyspareunia.  The patient {DOES_DOES WNU:81435} perform self breast exams.  There {is/is no:19420} notable family history of breast or ovarian cancer in her family.  The patient wears seatbelts: {yes/no:63}.   The patient has regular exercise: {yes/no/not asked:9010}.    The patient {Blank single:19197::reports,denies} current symptoms of depression.    Review of Systems: ROS  Past Medical History:  Patient Active Problem List   Diagnosis Date Noted Date Diagnosed   Hyperlipidemia 12/11/2023    Elevated vitamin B12 level 08/08/2022    LGSIL on Pap smear of cervix 06/08/2022    Vitamin D  deficiency 04/28/2021    Overweight (BMI 25.0-29.9) 04/24/2020    History of melasma 04/24/2020    PCOS (polycystic ovarian syndrome)     Atypical squamous cells of undetermined significance (ASCUS) on Papanicolaou smear of cervix     Class 1 obesity     Hirsutism 07/10/2017     Due to PCOS     Past Surgical History:  Past Surgical History:  Procedure Laterality Date   CESAREAN SECTION  01/26/2006   CESAREAN SECTION  08/10/2011   CESAREAN SECTION WITH BILATERAL TUBAL LIGATION Bilateral 10/29/2021   Procedure: CESAREAN SECTION WITH BILATERAL TUBAL LIGATION;  Surgeon: Alger Gong, MD;  Location: [facility] ORS;  Service: Obstetrics;  Laterality:  Bilateral;   COLPOSCOPY W/ BIOPSY / CURETTAGE  08/01/2014   BX Neg   TONSILLECTOMY AND ADENOIDECTOMY  1996    Gynecologic History:  No LMP recorded. Contraception: {method:5051} Last Pap: Results were: *** {Findings; lab pap smear results:16707::NIL and HR HPV+,NIL and HR HPV negative}  Last mammogram: *** Results were: {Blank single:19197::***,BI-RADS IV,BI-RAD III,BI-RAD II,BI-RAD I}  Obstetric History: H4E6976  Family History:  Family History  Problem Relation Age of Onset   Arthritis Mother    Diabetes Mother        also had a pituitary adenoma   Endocrine tumor Mother        cancerous pituitary tumor per pt    Cancer Mother 60       brain, lung cancer   Hypertension Father    Other Father        benign neck tumor   Arthritis Brother    Diabetes Maternal Grandmother    Heart disease Maternal Grandmother    Breast cancer Neg Hx     Social History:  Social History   Socioeconomic History   Marital status: Married    Spouse name: Alm Sherlean Barefoot   Number of children: 2   Years of education: Not on file   Highest education level: Some college, no degree  Occupational History   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   Enjoys spending time with family.   Never smoker    Social Drivers of Health   Tobacco Use: Low Risk (05/10/2024)   Patient History     Smoking Tobacco Use: Never    Smokeless Tobacco Use: Never    Passive Exposure: Not on file  Financial Resource Strain: Low Risk (05/10/2024)   Overall Financial Resource Strain (CARDIA)    Difficulty of Paying Living Expenses: Not very hard  Food Insecurity: No Food Insecurity (05/10/2024)   Epic    Worried About Programme Researcher, Broadcasting/film/video in the Last Year: Never true    Ran Out of Food in the Last Year: Never true  Transportation Needs: No Transportation Needs (05/10/2024)   Epic    Lack of Transportation (Medical): No    Lack of Transportation (Non-Medical): No  Physical Activity: Sufficiently Active (05/10/2024)   Exercise Vital Sign    Days of Exercise per Week: 7 days    Minutes of Exercise per Session: 150+ min  Stress: No Stress Concern Present (05/10/2024)   [employer] of Occupational Health - Occupational Stress Questionnaire    Feeling of Stress: Only a little  Social Connections: Moderately Integrated (05/10/2024)   Social Connection and Isolation Panel    Frequency of Communication with Friends and Family: More than three times a week    Frequency of Social Gatherings with Friends and Family: More than three times a week    Attends Religious Services: 1 to 4 times per year    Active Member of [employer] or Organizations: No    Attends Banker Meetings: Not on file    Marital Status: Married  Catering Manager Violence: Not on file  Depression (PHQ2-9): Low Risk (05/10/2024)   Depression (PHQ2-9)    PHQ-2 Score: 0  Alcohol Screen: Not on file  Housing: Unknown (05/10/2024)   Epic    Unable to Pay for Housing in the Last Year: No    Number of Times Moved in the Last Year: Not on file    Homeless in the Last Year: No  Utilities: Not on file  Health Literacy: Not on file    Allergies:  Allergies[1]  Medications: Prior to Admission medications  Medication Sig Start Date End Date Taking? Authorizing Provider  cholecalciferol (VITAMIN D3) 25  MCG (1000 UNIT) tablet Take 2,000 Units by mouth daily.    [provider]  Multiple Vitamin (MULTIVITAMIN) tablet Take 1 tablet by mouth daily.    [provider]  Phentermine  HCl (LOMAIRA ) 8 MG TABS Take 1.5 tablets (12 mg total) by mouth daily. Take with Topamax  12/11/23   Hope Merle, MD  topiramate  (TOPAMAX ) 25 MG tablet Take 3 tablets (75 mg total) by mouth daily. Take with Lomaira  daily. 06/10/24   Gretel App, NP    Physical Exam Vitals: There were no vitals taken for this visit.  General: NAD HEENT: normocephalic, anicteric Thyroid: no enlargement, no palpable nodules Pulmonary: No increased work of breathing, CTAB Cardiovascular: RRR, distal pulses 2+ Breast: Breast symmetrical, no tenderness, no palpable nodules or masses, no skin or nipple retraction present, no nipple discharge.  No axillary or supraclavicular lymphadenopathy. Abdomen: NABS, soft, non-tender, non-distended.  Umbilicus without lesions.  No hepatomegaly, splenomegaly or masses palpable. No evidence of hernia  Genitourinary:  External: Normal external female genitalia.  Normal urethral meatus, normal Bartholin's and Skene's glands.    Vagina: Normal vaginal mucosa, no evidence of prolapse.    Cervix: Grossly normal in appearance, no bleeding  Uterus: Non-enlarged, mobile, normal contour.  No CMT  Adnexa: ovaries non-enlarged, no adnexal masses  Rectal: deferred  Lymphatic: no evidence of inguinal lymphadenopathy Extremities: no edema, erythema, or tenderness Neurologic: Grossly intact Psychiatric: mood appropriate, affect full  Female chaperone present for pelvic and breast  portions of the physical exam    Assessment: 43 y.o. H4E6976 routine annual exam  Plan: Problem List Items Addressed This Visit   None   1) Mammogram - recommend yearly screening mammogram.  Mammogram {Blank single:19197::Is up to date,Was ordered today}   2) STI screening  {Blank single:19197::was,was  not}offered and {Blank single:19197::accepted,declined,therefore not obtained}  3) ASCCP guidelines and rational discussed.  Patient opts for {Blank single:19197::***,every 5 years,every 3 years,yearly,discontinue age >65,discontinue secondary to prior hysterectomy} screening interval  4) Contraception - the patient is currently using  {method:5051}.  She is {Blank single:19197::happy with her current form of contraception and plans to continue,interested in changing to ***,interested in starting Contraception: ***,not currently in need of contraception secondary to being sterile,attempting to conceive in the near future}  5) Colonoscopy -- Screening recommended starting at age 56 for average risk individuals, age 69 for individuals deemed at increased risk (including African Americans) and recommended to continue until age 72.  For patient age 25-85 individualized approach is recommended.  Gold standard screening is via colonoscopy, Cologuard screening is an acceptable alternative for patient unwilling or unable to undergo colonoscopy.  Colorectal cancer screening for average-risk adults: 2018 guideline update from the American Cancer SocietyCA: A Cancer Journal for Clinicians: Nov 30, 2016   6) Routine healthcare maintenance including cholesterol, diabetes screening discussed {Blank single:19197::managed by PCP,Ordered today,To return fasting at a later date,Declines}  7) No follow-ups on file.  Jinnie Cookey, CNM  [facility] 06/21/2024, 2:12 PM              [1] No Known Allergies "

## 2024-06-21 NOTE — Patient Instructions (Incomplete)
 Preventive Care 44-44 Years Old, Female  Preventive care refers to lifestyle choices and visits with your health care provider that can promote health and wellness. Preventive care visits are also called wellness exams.  What can I expect for my preventive care visit?  Counseling  Your health care provider may ask you questions about your:  Medical history, including:  Past medical problems.  Family medical history.  Pregnancy history.  Current health, including:  Menstrual cycle.  Method of birth control.  Emotional well-being.  Home life and relationship well-being.  Sexual activity and sexual health.  Lifestyle, including:  Alcohol, nicotine or tobacco, and drug use.  Access to firearms.  Diet, exercise, and sleep habits.  Work and work Astronomer.  Sunscreen use.  Safety issues such as seatbelt and bike helmet use.  Physical exam  Your health care provider will check your:  Height and weight. These may be used to calculate your BMI (body mass index). BMI is a measurement that tells if you are at a healthy weight.  Waist circumference. This measures the distance around your waistline. This measurement also tells if you are at a healthy weight and may help predict your risk of certain diseases, such as type 2 diabetes and high blood pressure.  Heart rate and blood pressure.  Body temperature.  Skin for abnormal spots.  What immunizations do I need?    Vaccines are usually given at various ages, according to a schedule. Your health care provider will recommend vaccines for you based on your age, medical history, and lifestyle or other factors, such as travel or where you work.  What tests do I need?  Screening  Your health care provider may recommend screening tests for certain conditions. This may include:  Lipid and cholesterol levels.  Diabetes screening. This is done by checking your blood sugar (glucose) after you have not eaten for a while (fasting).  Pelvic exam and Pap test.  Hepatitis B test.  Hepatitis C  test.  HIV (human immunodeficiency virus) test.  STI (sexually transmitted infection) testing, if you are at risk.  Lung cancer screening.  Colorectal cancer screening.  Mammogram. Talk with your health care provider about when you should start having regular mammograms. This may depend on whether you have a family history of breast cancer.  BRCA-related cancer screening. This may be done if you have a family history of breast, ovarian, tubal, or peritoneal cancers.  Bone density scan. This is done to screen for osteoporosis.  Talk with your health care provider about your test results, treatment options, and if necessary, the need for more tests.  Follow these instructions at home:  Eating and drinking    Eat a diet that includes fresh fruits and vegetables, whole grains, lean protein, and low-fat dairy products.  Take vitamin and mineral supplements as recommended by your health care provider.  Do not drink alcohol if:  Your health care provider tells you not to drink.  You are pregnant, may be pregnant, or are planning to become pregnant.  If you drink alcohol:  Limit how much you have to 0-1 drink a day.  Know how much alcohol is in your drink. In the [place] one drink equals one 12 oz bottle of beer (355 mL), one 5 oz glass of wine (148 mL), or one 1 oz glass of hard liquor (44 mL).  Lifestyle  Brush your teeth every morning and night with fluoride toothpaste. Floss one time each day.  Exercise for at least  30 minutes 5 or more days each week.  Do not use any products that contain nicotine or tobacco. These products include cigarettes, chewing tobacco, and vaping devices, such as e-cigarettes. If you need help quitting, ask your health care provider.  Do not use drugs.  If you are sexually active, practice safe sex. Use a condom or other form of protection to prevent STIs.  If you do not wish to become pregnant, use a form of birth control. If you plan to become pregnant, see your health care provider for a  prepregnancy visit.  Take aspirin only as told by your health care provider. Make sure that you understand how much to take and what form to take. Work with your health care provider to find out whether it is safe and beneficial for you to take aspirin daily.  Find healthy ways to manage stress, such as:  Meditation, yoga, or listening to music.  Journaling.  Talking to a trusted person.  Spending time with friends and family.  Minimize exposure to UV radiation to reduce your risk of skin cancer.  Safety  Always wear your seat belt while driving or riding in a vehicle.  Do not drive:  If you have been drinking alcohol. Do not ride with someone who has been drinking.  When you are tired or distracted.  While texting.  If you have been using any mind-altering substances or drugs.  Wear a helmet and other protective equipment during sports activities.  If you have firearms in your house, make sure you follow all gun safety procedures.  Seek help if you have been physically or sexually abused.  What's next?  Visit your health care provider once a year for an annual wellness visit.  Ask your health care provider how often you should have your eyes and teeth checked.  Stay up to date on all vaccines.  This information is not intended to replace advice given to you by your health care provider. Make sure you discuss any questions you have with your health care provider.  Document Revised: 12/16/2020 Document Reviewed: 12/16/2020  Elsevier Patient Education  2024 [employer].

## 2024-06-24 ENCOUNTER — Ambulatory Visit (INDEPENDENT_AMBULATORY_CARE_PROVIDER_SITE_OTHER): Admitting: Licensed Practical Nurse

## 2024-06-24 ENCOUNTER — Encounter: Payer: Self-pay | Admitting: Licensed Practical Nurse

## 2024-06-24 ENCOUNTER — Other Ambulatory Visit (HOSPITAL_COMMUNITY)
Admission: RE | Admit: 2024-06-24 | Discharge: 2024-06-24 | Disposition: A | Source: Ambulatory Visit | Attending: Licensed Practical Nurse | Admitting: Licensed Practical Nurse

## 2024-06-24 VITALS — BP 133/76 | HR 71 | Ht 61.0 in | Wt 155.6 lb

## 2024-06-24 DIAGNOSIS — Z124 Encounter for screening for malignant neoplasm of cervix: Secondary | ICD-10-CM

## 2024-06-24 DIAGNOSIS — Z01419 Encounter for gynecological examination (general) (routine) without abnormal findings: Secondary | ICD-10-CM | POA: Diagnosis not present

## 2024-06-26 LAB — CYTOLOGY - PAP
Comment: NEGATIVE
Diagnosis: NEGATIVE
High risk HPV: NEGATIVE

## 2024-07-24 ENCOUNTER — Encounter: Payer: Self-pay | Admitting: Dermatology

## 2024-07-24 ENCOUNTER — Encounter (INDEPENDENT_AMBULATORY_CARE_PROVIDER_SITE_OTHER): Payer: Self-pay

## 2024-07-24 ENCOUNTER — Ambulatory Visit: Payer: 59 | Admitting: Dermatology

## 2024-07-24 DIAGNOSIS — L811 Chloasma: Secondary | ICD-10-CM

## 2024-07-24 DIAGNOSIS — L578 Other skin changes due to chronic exposure to nonionizing radiation: Secondary | ICD-10-CM

## 2024-07-24 DIAGNOSIS — Z79899 Other long term (current) drug therapy: Secondary | ICD-10-CM | POA: Diagnosis not present

## 2024-07-24 DIAGNOSIS — Z7189 Other specified counseling: Secondary | ICD-10-CM

## 2024-07-24 NOTE — Progress Notes (Signed)
" ° °  Follow-Up Visit   Subjective  Kimberly Holder is a 44 y.o. female who presents for the following: 1 year follow up on melasma Currently doing well on skin medicinals hydroquinone  treatment.   The following portions of the chart were reviewed this encounter and updated as appropriate: medications, allergies, medical history  Review of Systems:  No other skin or systemic complaints except as noted in HPI or Assessment and Plan.  Objective  Well appearing patient in no apparent distress; mood and affect are within normal limits.  A focused examination was performed of the following areas: Face  Relevant exam findings are noted in the Assessment and Plan. Melasma photos                Assessment & Plan    MELASMA Exam: reticulated hyperpigmented patches at face Has improved on hydroquinone  treatment  See Photos today  Chronic and persistent condition with duration or expected duration over one year. Condition is symptomatic/ bothersome to patient. Not currently at goal. Melasma is a chronic; persistent condition of hyperpigmented patches generally on the face, worse in summer due to higher UV exposure.    Heredity; thyroid disease; sun exposure; pregnancy; birth control pills; epilepsy medication and darker skin may predispose to Melasma.   Recommendations include: - Sun avoidance and daily broad spectrum (UVA/UVB) tinted mineral sunscreen SPF 30+, with Zinc or Titanium Dioxide. - Rx topical bleaching creams (i.e. hydroquinone ) is a common treatment but should not be used long term.  Hydroquinones may be mixed with retinoids; vitamin C; steroids; Kojic Acid. - Alastin A-luminate, retinoids, vitamin C, topical tranexamic acid , glycolic acid and kojic acid can be used for brightening while on break from hydroquinone  - Rx Azelaic Acid is also a treatment option that is safe for pregnancy (Category B). - OTC Heliocare can be helpful in control and prevention. - Oral Rx  with Tranexamic Acid  250 mg - 650 mg po daily can be used for moderate to severe cases especially during summer (contraindications include pregnancy; lactation; hx of PE; hx of DVT; clotting disorder; heart disease; anticoagulant use and upcoming long trips)   - Chemical peels (would need multiple for best result).  - Lasers and  Microdermabrasion may also be helpful adjunct treatments.   Treatment Plan: Continue  Skin Medicinals Hydroquinone  12%/kojic acid Kojic Acid 6% / Niacinamide 2% / Vitamin C 1% Cream pea sized amount twice daily to the entire face for up to 3 months. This cannot be used more than 3 months due to risk of exogenous ochronosis (permanent dark spots). The patient was advised this is not covered by insurance since it is made by a compounding pharmacy. They will receive an email to check out and the medication will be mailed to their home.  Long term medication management.  Patient is using long term (months to years) prescription medication  to control their dermatologic condition.  These medications require periodic monitoring to evaluate for efficacy and side effects and may require periodic laboratory monitoring.   ACTINIC SKIN DAMAGE   COUNSELING AND COORDINATION OF CARE   MEDICATION MANAGEMENT   MELASMA    Return in about 1 year (around 07/24/2025) for melasma .  IEleanor Blush, CMA, am acting as scribe for Alm Rhyme, MD.   Documentation: I have reviewed the above documentation for accuracy and completeness, and I agree with the above.  Alm Rhyme, MD    "

## 2024-07-24 NOTE — Patient Instructions (Addendum)
 Instructions for [facility] Medications  One or more of your medications was sent to the [facility] mail order compounding pharmacy. You will receive an email from them and can purchase the medicine through that link. It will then be mailed to your home at the address you confirmed. If for any reason you do not receive an email from them, please check your spam folder. If you still do not find the email, please let us  know. [facility] phone number is [phone].   Due to recent changes in healthcare laws, you may see results of your pathology and/or laboratory studies on MyChart before the doctors have had a chance to review them. We understand that in some cases there may be results that are confusing or concerning to you. Please understand that not all results are received at the same time and often the doctors may need to interpret multiple results in order to provide you with the best plan of care or course of treatment. Therefore, we ask that you please give us  2 business days to thoroughly review all your results before contacting the office for clarification. Should we see a critical lab result, you will be contacted sooner.   If You Need Anything After Your Visit  If you have any questions or concerns for your doctor, please call our main line at [phone] and press option 4 to reach your doctor's medical assistant. If no one answers, please leave a voicemail as directed and we will return your call as soon as possible. Messages left after 4 pm will be answered the following business day.   You may also send us  a message via MyChart. We typically respond to MyChart messages within 1-2 business days.  For prescription refills, please ask your pharmacy to contact our office. Our fax number is [phone].  If you have an urgent issue when the clinic is closed that cannot wait until the next business day, you can page your doctor at the number below.    Please note that  while we do our best to be available for urgent issues outside of office hours, we are not available 24/7.   If you have an urgent issue and are unable to reach us , you may choose to seek medical care at your doctor's office, retail clinic, urgent care center, or emergency room.  If you have a medical emergency, please immediately call 911 or go to the emergency department.  Pager Numbers  - Dr. Hester: [phone]  - Dr. Jackquline: [phone]  - Dr. Claudene: [phone]   - Dr. Raymund: [phone]  In the event of inclement weather, please call our main line at [phone] for an update on the status of any delays or closures.  Dermatology Medication Tips: Please keep the boxes that topical medications come in in order to help keep track of the instructions about where and how to use these. Pharmacies typically print the medication instructions only on the boxes and not directly on the medication tubes.   If your medication is too expensive, please contact our office at [phone] option 4 or send us  a message through MyChart.   We are unable to tell what your co-pay for medications will be in advance as this is different depending on your insurance coverage. However, we may be able to find a substitute medication at lower cost or fill out paperwork to get insurance to cover a needed medication.   If a prior authorization is required to get your medication covered by  your insurance company, please allow us  1-2 business days to complete this process.  Drug prices often vary depending on where the prescription is filled and some pharmacies may offer cheaper prices.  The website www.goodrx.com contains coupons for medications through different pharmacies. The prices here do not account for what the cost may be with help from insurance (it may be cheaper with your insurance), but the website can give you the price if you did not use any insurance.  - You can print the associated  coupon and take it with your prescription to the pharmacy.  - You may also stop by our office during regular business hours and pick up a GoodRx coupon card.  - If you need your prescription sent electronically to a different pharmacy, notify our office through [facility] or by phone at [phone] option 4.     Si Usted Necesita Algo Despus de Su Visita  Tambin puede enviarnos un mensaje a travs de Clinical cytogeneticist. Por lo general respondemos a los mensajes de MyChart en el transcurso de 1 a 2 das hbiles.  Para renovar recetas, por favor pida a su farmacia que se ponga en contacto con nuestra oficina. Randi lakes de fax es [place] [phone].  Si tiene un asunto urgente cuando la clnica est cerrada y que no puede esperar hasta el siguiente da hbil, puede llamar/localizar a su doctor(a) al nmero que aparece a continuacin.   Por favor, tenga en cuenta que aunque hacemos todo lo posible para estar disponibles para asuntos urgentes fuera del horario de [place], no estamos disponibles las 24 horas del da, los 7 [address]  [address] de la [place].   Si tiene un problema urgente y no puede comunicarse con nosotros, puede optar por buscar atencin mdica  en el consultorio de su doctor(a), en una clnica privada, en un centro de atencin urgente o en una sala de emergencias.  Si tiene Engineer, drilling, por favor llame inmediatamente al 911 o vaya a la sala de emergencias.  Nmeros de bper  - Dr. Hester: [phone]  - Dra. Jackquline: [phone]  - Dr. Claudene: [phone]  - Dra. Kitts: [phone]  En caso de inclemencias del [place], por favor llame a nuestra lnea principal al [phone] para una actualizacin sobre el estado de cualquier retraso o cierre.  Consejos para la medicacin en dermatologa: Por favor, guarde las cajas en las que vienen los medicamentos de uso tpico para ayudarle a seguir las instrucciones sobre dnde y cmo usarlos. Las farmacias generalmente imprimen  las instrucciones del medicamento slo en las cajas y no directamente en los tubos del [place].   Si su medicamento es muy caro, por favor, pngase en contacto con landry rieger llamando al [phone] y presione la opcin 4 o envenos un mensaje a travs de Clinical cytogeneticist.   No podemos decirle cul ser su copago por los medicamentos por adelantado ya que esto es diferente dependiendo de la cobertura de su seguro. Sin embargo, es posible que podamos encontrar un medicamento sustituto a Audiological scientist un formulario para que el seguro cubra el medicamento que se considera necesario.   Si se requiere una autorizacin previa para que su compaa de seguros malta su medicamento, por favor permtanos de 1 a 2 das hbiles para completar este proceso.  Los precios de los medicamentos varan con frecuencia dependiendo del Environmental consultant de dnde se surte la receta y alguna farmacias pueden ofrecer precios ms baratos.  El sitio web www.goodrx.com tiene cupones para medicamentos de Health and safety inspector.  Los precios aqu no tienen en cuenta lo que podra costar con la ayuda del seguro (puede ser ms barato con su seguro), pero el sitio web puede darle el precio si no utiliz Tourist information centre manager.  - Puede imprimir el cupn correspondiente y llevarlo con su receta a la farmacia.  - Tambin puede pasar por nuestra oficina durante el horario de atencin regular y Education officer, museum una tarjeta de cupones de GoodRx.  - Si necesita que su receta se enve electrnicamente a una farmacia diferente, informe a nuestra oficina a travs de MyChart de  o por telfono llamando al [phone] y presione la opcin 4.

## 2024-08-08 ENCOUNTER — Encounter (INDEPENDENT_AMBULATORY_CARE_PROVIDER_SITE_OTHER): Payer: Self-pay

## 2025-07-24 ENCOUNTER — Ambulatory Visit: Admitting: Dermatology
